# Patient Record
Sex: Male | Born: 1960 | Race: Black or African American | Hispanic: No | Marital: Married | Smoking: Never smoker
Health system: Southern US, Community
[De-identification: ages and names within clinical notes are randomized; demographics above are authoritative.]

## PROBLEM LIST (undated history)

## (undated) DIAGNOSIS — S0990XA Unspecified injury of head, initial encounter: Secondary | ICD-10-CM

## (undated) DIAGNOSIS — E349 Endocrine disorder, unspecified: Secondary | ICD-10-CM

## (undated) DIAGNOSIS — H269 Unspecified cataract: Secondary | ICD-10-CM

## (undated) DIAGNOSIS — N62 Hypertrophy of breast: Secondary | ICD-10-CM

## (undated) DIAGNOSIS — I493 Ventricular premature depolarization: Secondary | ICD-10-CM

## (undated) DIAGNOSIS — E785 Hyperlipidemia, unspecified: Secondary | ICD-10-CM

## (undated) DIAGNOSIS — H9209 Otalgia, unspecified ear: Secondary | ICD-10-CM

## (undated) DIAGNOSIS — R740 Nonspecific elevation of levels of transaminase and lactic acid dehydrogenase [LDH]: Secondary | ICD-10-CM

## (undated) DIAGNOSIS — M549 Dorsalgia, unspecified: Secondary | ICD-10-CM

## (undated) DIAGNOSIS — E291 Testicular hypofunction: Secondary | ICD-10-CM

## (undated) DIAGNOSIS — I10 Essential (primary) hypertension: Secondary | ICD-10-CM

## (undated) HISTORY — DX: Nonspecific elevation of levels of transaminase and lactic acid dehydrogenase (ldh): R74.0

## (undated) HISTORY — DX: Hypertrophy of breast: N62

## (undated) HISTORY — DX: Essential (primary) hypertension: I10

## (undated) HISTORY — DX: Hyperlipidemia, unspecified: E78.5

## (undated) HISTORY — DX: Otalgia, unspecified ear: H92.09

## (undated) HISTORY — DX: Unspecified cataract: H26.9

## (undated) HISTORY — DX: Unspecified injury of head, initial encounter: S09.90XA

## (undated) HISTORY — PX: NO PAST SURGERIES: SHX2092

## (undated) HISTORY — DX: Ventricular premature depolarization: I49.3

## (undated) HISTORY — DX: Testicular hypofunction: E29.1

## (undated) HISTORY — DX: Endocrine disorder, unspecified: E34.9

## (undated) HISTORY — DX: Dorsalgia, unspecified: M54.9

---

## 1998-03-29 ENCOUNTER — Emergency Department (HOSPITAL_COMMUNITY): Admission: EM | Admit: 1998-03-29 | Discharge: 1998-03-29 | Payer: Self-pay | Admitting: Emergency Medicine

## 1998-07-21 ENCOUNTER — Encounter: Admission: RE | Admit: 1998-07-21 | Discharge: 1998-07-21 | Payer: Self-pay | Admitting: *Deleted

## 2000-01-07 ENCOUNTER — Emergency Department (HOSPITAL_COMMUNITY): Admission: EM | Admit: 2000-01-07 | Discharge: 2000-01-07 | Payer: Self-pay | Admitting: Emergency Medicine

## 2005-01-29 ENCOUNTER — Ambulatory Visit: Payer: Self-pay | Admitting: Internal Medicine

## 2006-10-21 ENCOUNTER — Ambulatory Visit: Payer: Self-pay | Admitting: Internal Medicine

## 2006-12-30 ENCOUNTER — Ambulatory Visit: Payer: Self-pay | Admitting: Internal Medicine

## 2006-12-30 LAB — CONVERTED CEMR LAB
ALT: 12 units/L (ref 0–40)
AST: 17 units/L (ref 0–37)
Basophils Relative: 0.5 % (ref 0.0–1.0)
Bilirubin, Direct: 0.1 mg/dL (ref 0.0–0.3)
CO2: 29 meq/L (ref 19–32)
Calcium: 9.3 mg/dL (ref 8.4–10.5)
Chloride: 107 meq/L (ref 96–112)
Eosinophils Relative: 3 % (ref 0.0–5.0)
Glucose, Bld: 108 mg/dL — ABNORMAL HIGH (ref 70–99)
HDL: 42.6 mg/dL (ref >39.0)
Lymphocytes Relative: 44.8 % (ref 12.0–46.0)
Neutro Abs: 1.7 10*3/uL (ref 1.4–7.7)
Platelets: 207 10*3/uL (ref 150–400)
Total Protein: 7.2 g/dL (ref 6.0–8.3)
VLDL: 17 mg/dL (ref 0–40)
WBC: 3.9 10*3/uL — ABNORMAL LOW (ref 4.5–10.5)

## 2007-09-21 ENCOUNTER — Ambulatory Visit: Payer: Self-pay | Admitting: Internal Medicine

## 2007-09-21 DIAGNOSIS — H9209 Otalgia, unspecified ear: Secondary | ICD-10-CM

## 2007-09-21 HISTORY — DX: Otalgia, unspecified ear: H92.09

## 2007-10-23 ENCOUNTER — Ambulatory Visit: Payer: Self-pay | Admitting: Internal Medicine

## 2007-10-23 DIAGNOSIS — E785 Hyperlipidemia, unspecified: Secondary | ICD-10-CM

## 2007-10-23 HISTORY — DX: Hyperlipidemia, unspecified: E78.5

## 2008-04-25 ENCOUNTER — Telehealth: Payer: Self-pay | Admitting: Internal Medicine

## 2008-04-26 ENCOUNTER — Ambulatory Visit: Payer: Self-pay | Admitting: Internal Medicine

## 2008-07-07 ENCOUNTER — Ambulatory Visit: Payer: Self-pay | Admitting: Internal Medicine

## 2008-07-07 DIAGNOSIS — N62 Hypertrophy of breast: Secondary | ICD-10-CM

## 2008-07-07 HISTORY — DX: Hypertrophy of breast: N62

## 2008-07-07 LAB — CONVERTED CEMR LAB: hCG, Beta Chain, Quant, S: <2 milliintl units/mL

## 2008-07-08 LAB — CONVERTED CEMR LAB
Albumin: 4.1 g/dL (ref 3.5–5.2)
Alkaline Phosphatase: 52 units/L (ref 39–117)
BUN: 12 mg/dL (ref 6–23)
Bilirubin, Direct: 0.1 mg/dL (ref 0.0–0.3)
Eosinophils Absolute: 0.1 10*3/uL (ref 0.0–0.7)
GFR calc Af Amer: 92 mL/min
GFR calc non Af Amer: 76 mL/min
HCT: 40.6 % (ref 39.0–52.0)
MCV: 89.2 fL (ref 78.0–100.0)
Monocytes Absolute: 0.3 10*3/uL (ref 0.1–1.0)
Platelets: 154 10*3/uL (ref 150–400)
Potassium: 4.3 meq/L (ref 3.5–5.1)
RDW: 12.3 % (ref 11.5–14.6)
Sodium: 141 meq/L (ref 135–145)
Testosterone: 339.22 ng/dL — ABNORMAL LOW (ref 350.00–890)

## 2008-10-27 ENCOUNTER — Ambulatory Visit: Payer: Self-pay | Admitting: Internal Medicine

## 2008-10-27 DIAGNOSIS — M549 Dorsalgia, unspecified: Secondary | ICD-10-CM

## 2008-10-27 HISTORY — DX: Dorsalgia, unspecified: M54.9

## 2008-10-28 ENCOUNTER — Telehealth: Payer: Self-pay | Admitting: Internal Medicine

## 2008-11-18 ENCOUNTER — Ambulatory Visit: Payer: Self-pay | Admitting: Internal Medicine

## 2009-05-23 ENCOUNTER — Emergency Department (HOSPITAL_COMMUNITY): Admission: EM | Admit: 2009-05-23 | Discharge: 2009-05-23 | Payer: Self-pay | Admitting: Family Medicine

## 2009-05-24 ENCOUNTER — Ambulatory Visit: Payer: Self-pay | Admitting: Internal Medicine

## 2009-05-24 DIAGNOSIS — M25519 Pain in unspecified shoulder: Secondary | ICD-10-CM | POA: Insufficient documentation

## 2009-12-15 ENCOUNTER — Ambulatory Visit: Payer: Self-pay | Admitting: Internal Medicine

## 2009-12-15 LAB — CONVERTED CEMR LAB
AST: 19 units/L (ref 0–37)
Alkaline Phosphatase: 52 units/L (ref 39–117)
BUN: 11 mg/dL (ref 6–23)
Basophils Absolute: 0 10*3/uL (ref 0.0–0.1)
Bilirubin, Direct: 0 mg/dL (ref 0.0–0.3)
Calcium: 9.2 mg/dL (ref 8.4–10.5)
GFR calc non Af Amer: 82.24 mL/min (ref >60)
Glucose, Bld: 94 mg/dL (ref 70–99)
Glucose, Urine, Semiquant: NEGATIVE
HDL: 43.9 mg/dL (ref >39.00)
Ketones, urine, test strip: NEGATIVE
Lymphocytes Relative: 39.9 % (ref 12.0–46.0)
Lymphs Abs: 1.6 10*3/uL (ref 0.7–4.0)
Monocytes Relative: 6.6 % (ref 3.0–12.0)
Neutrophils Relative %: 47 % (ref 43.0–77.0)
Nitrite: NEGATIVE
Platelets: 172 10*3/uL (ref 150.0–400.0)
RDW: 13.7 % (ref 11.5–14.6)
Specific Gravity, Urine: 1.015
TSH: 1.64 microintl units/mL (ref 0.35–5.50)
Total Bilirubin: 0.8 mg/dL (ref 0.3–1.2)
Triglycerides: 78 mg/dL (ref 0.0–149.0)
VLDL: 15.6 mg/dL (ref 0.0–40.0)
pH: 8.5

## 2009-12-21 ENCOUNTER — Telehealth: Payer: Self-pay | Admitting: Internal Medicine

## 2010-01-04 ENCOUNTER — Ambulatory Visit: Payer: Self-pay | Admitting: Internal Medicine

## 2010-10-02 NOTE — Assessment & Plan Note (Signed)
Summary: CPX/CJR/RSC PT/CJR   Vital Signs:  Patient profile:   50 year old male Height:      66 inches Weight:      168 pounds BMI:     27.21 Temp:     97.7 degrees F oral BP sitting:   100 / 60  (right arm) Cuff size:   regular  Vitals Entered By: Duard Brady LPN (Jan 05, 6044 2:35 PM) CC: cpx - doing well    labs done Is Patient Diabetic? No   CC:  cpx - doing well    labs done.  History of Present Illness: 50 year old gentleman, African-American male, who is seen today for a health maintenance examination.  He immigrated from [place] about 20 years ago and does have a history of malaria and also a history of a positive PPD.  He is doing well today without concerns or complaints  Preventive Screening-Counseling & Management  Alcohol-Tobacco     Smoking Status: never  Allergies (verified): No Known Drug Allergies  Past History:  Past Medical History: Reviewed history from 11/18/2008 and no changes required. Hyperlipidemia remote history of malaria history of closed head injury in 1990 gynecomastia testosterone deficiency  Past Surgical History: Reviewed history from 10/23/2007 and no changes required. none  Family History: Reviewed history from 10/23/2007 and no changes required. details of his family history unclear, but felt as live in [place].  Father does have a history of diabetes and malaria and otherwise apparent enjoys good health.  Mother has a history of arthritis  4 brothers 3 sisters, health unknown  Social History: Reviewed history from 10/23/2007 and no changes required. 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 resident  in the states for approximately 17 yearsSmoking Status:  never  Review of Systems  The patient denies anorexia, fever, weight loss, weight gain, vision loss, decreased hearing, hoarseness, chest pain, syncope, dyspnea on exertion, peripheral edema, prolonged cough, headaches, hemoptysis, abdominal pain, melena, hematochezia,  severe indigestion/heartburn, hematuria, incontinence, genital sores, muscle weakness, suspicious skin lesions, transient blindness, difficulty walking, depression, unusual weight change, abnormal bleeding, enlarged lymph nodes, angioedema, breast masses, and testicular masses.    Physical Exam  General:  Well-developed,well-nourished,in no acute distress; alert,appropriate and cooperative throughout examination Head:  Normocephalic and atraumatic without obvious abnormalities. No apparent alopecia or balding. Eyes:  No corneal or conjunctival inflammation noted. EOMI. Perrla. Funduscopic exam benign, without hemorrhages, exudates or papilledema. Vision grossly normal. Ears:  External ear exam shows no significant lesions or deformities.  Otoscopic examination reveals clear canals, tympanic membranes are intact bilaterally without bulging, retraction, inflammation or discharge. Hearing is grossly normal bilaterally. Mouth:  Oral mucosa and oropharynx without lesions or exudates.  Teeth in good repair. Neck:  No deformities, masses, or tenderness noted. Chest Wall:  No deformities, masses, tenderness or gynecomastia noted. Breasts:  No masses or gynecomastia noted Lungs:  Normal respiratory effort, chest expands symmetrically. Lungs are clear to auscultation, no crackles or wheezes. Heart:  Normal rate and regular rhythm. S1 and S2 normal without gallop, murmur, click, rub or other extra sounds. Abdomen:  Bowel sounds positive,abdomen soft and non-tender without masses, organomegaly or hernias noted. Rectal:  No external abnormalities noted. Normal sphincter tone. No rectal masses or tenderness. Genitalia:  Testes bilaterally descended without nodularity, tenderness or masses. No scrotal masses or lesions. No penis lesions or urethral discharge. Prostate:  Prostate gland firm and smooth, no enlargement, nodularity, tenderness, mass, asymmetry or induration. Msk:  No deformity or scoliosis noted of  thoracic or lumbar spine.   Pulses:  R and L carotid,radial,femoral,dorsalis pedis and posterior tibial pulses are full and equal bilaterally Extremities:  No clubbing, cyanosis, edema, or deformity noted with normal full range of motion of all joints.   Neurologic:  No cranial nerve deficits noted. Station and gait are normal. Plantar reflexes are down-going bilaterally. DTRs are symmetrical throughout. Sensory, motor and coordinative functions appear intact. Skin:  Intact without suspicious lesions or rashes Cervical Nodes:  No lymphadenopathy noted Axillary Nodes:  No palpable lymphadenopathy Inguinal Nodes:  No significant adenopathy Psych:  Cognition and judgment appear intact. Alert and cooperative with normal attention span and concentration. No apparent delusions, illusions, hallucinations   Impression & Recommendations:  Problem # 1:  HEALTH MAINTENANCE EXAM (ICD-V70.0)  Complete Medication List: 1)  Tramadol Hcl 50 Mg Tabs (Tramadol hcl) .... One every 6 hours for pain prn 2)  Diclofenac Sodium 75 Mg Tbec (Diclofenac sodium) .... One twice daily prn 3)  Cyclobenzaprine Hcl 10 Mg Tabs (Cyclobenzaprine hcl) .... One tablet 3 times daily for acute pain prn  Patient Instructions: 1)  Please schedule a follow-up appointment in 1 year. 2)  It is important that you exercise regularly at least 20 minutes 5 times a week. If you develop chest pain, have severe difficulty breathing, or feel very tired , stop exercising immediately and seek medical attention. 3)  Schedule a colonoscopy/sigmoidoscopy to help detect colon cancer. Prescriptions: DICLOFENAC SODIUM 75 MG TBEC (DICLOFENAC SODIUM) one twice daily prn  #50 x 4   Entered and Authorized by:   Gordy Savers  MD   Signed by:   Gordy Savers  MD on 01/04/2010   Method used:   Print then Give to Patient   RxID:   [id number] TRAMADOL HCL 50 MG  TABS (TRAMADOL HCL) one every 6 hours for pain prn  #50 x 4   Entered and  Authorized by:   Gordy Savers  MD   Signed by:   Gordy Savers  MD on 01/04/2010   Method used:   Print then Give to Patient   RxID:   [id number]

## 2010-10-02 NOTE — Progress Notes (Signed)
Summary: No call No show   no call no show    attempt to call home # - ans mach- LMTCB to reschedule or explain NCNS.  Office will be closed tomorrow , Friday    KIK

## 2010-10-30 ENCOUNTER — Encounter: Payer: Self-pay | Admitting: Internal Medicine

## 2010-10-30 ENCOUNTER — Ambulatory Visit (INDEPENDENT_AMBULATORY_CARE_PROVIDER_SITE_OTHER): Payer: 59 | Admitting: Internal Medicine

## 2010-10-30 DIAGNOSIS — R12 Heartburn: Secondary | ICD-10-CM

## 2010-10-30 DIAGNOSIS — R079 Chest pain, unspecified: Secondary | ICD-10-CM

## 2010-10-30 NOTE — Progress Notes (Signed)
  Subjective:    Patient ID: Derek Wong, male    DOB: 06-13-1961, 50 y.o.   MRN: [id number]  HPI   50 year old patient who presents with a one-month history of intermittent chest pain. He describes both a typical midchest heartburn sensation that occurs primarily after meals. He also describes another vague pressure sensation in the left shoulder and arm area. Symptoms seem worse after meals. He denies any nocturnal symptoms. He denies any exertional chest pain. He does have a history of mild dyslipidemia. No other cardiovascular risk factors. EKG today is normal    Review of Systems  Constitutional: Negative for fever, chills, appetite change and fatigue.  HENT: Negative for hearing loss, ear pain, congestion, sore throat, trouble swallowing, neck stiffness, dental problem, voice change and tinnitus.   Eyes: Negative for pain, discharge and visual disturbance.  Respiratory: Negative for cough, chest tightness, wheezing and stridor.   Cardiovascular: Positive for chest pain. Negative for palpitations and leg swelling.  Gastrointestinal: Negative for nausea, vomiting, abdominal pain, diarrhea, constipation, blood in stool and abdominal distention.  Genitourinary: Negative for urgency, hematuria, flank pain, discharge, difficulty urinating and genital sores.  Musculoskeletal: Negative for myalgias, back pain, joint swelling, arthralgias and gait problem.  Skin: Negative for rash.  Neurological: Negative for dizziness, syncope, speech difficulty, weakness, numbness and headaches.  Hematological: Negative for adenopathy. Does not bruise/bleed easily.  Psychiatric/Behavioral: Negative for behavioral problems and dysphoric mood. The patient is not nervous/anxious.        Objective:   Physical Exam  Constitutional: He is oriented to person, place, and time. He appears well-developed.  HENT:  Head: Normocephalic.  Right Ear: External ear normal.  Left Ear: External ear normal.  Eyes:  Conjunctivae and EOM are normal.  Neck: Normal range of motion.  Cardiovascular: Normal rate, regular rhythm, normal heart sounds and intact distal pulses.   Pulmonary/Chest: Breath sounds normal.        Bilateral gynecomastia  Abdominal: Bowel sounds are normal. He exhibits no distension. There is no tenderness.  Musculoskeletal: Normal range of motion. He exhibits no edema and no tenderness.  Neurological: He is alert and oriented to person, place, and time.  Psychiatric: He has a normal mood and affect. His behavior is normal.          Assessment & Plan:   atypical chest pain most consistent with reflux.   Low risk of coronary artery disease and no exertional symptoms . We'll place on a antireflux regimen and a proton pump inhibitor and clinically observed. If symptoms do not pop to resolve we'll consider for more diagnostic testing

## 2010-10-30 NOTE — Patient Instructions (Signed)
Avoids foods high in acid such as tomatoes citrus juices, and spicy foods.  Avoid eating within two hours of lying down or before exercising.  Do not overheat.  Try smaller more frequent meals.  If symptoms persist, elevate the head of her bed 12 inches while sleeping.  Call or return to clinic prn if these symptoms worsen or fail to improve as anticipated. Acid Reflux (GERD)   Acid reflux is also known as Gastroesophageal Reflux Disease (GERD). Your stomach makes acid to help digest food. Acid reflux happens when acid from your stomach goes into the tube between your mouth and stomach (esophagus). Your stomach is protected from the acid, but your esophagus is not. When acid gets into the esophagus, it may cause a burning feeling in the chest (heartburn). Besides heartburn, other health problems can happen if the acid keeps going into the esophagus. Some causes of acid reflux include:  Being overweight.  Smoking.  Drinking alcohol.  Eating large meals.  Eating meals and then going to bed right away.  Eating certain foods.  Increased stomach acid production.   HOME CARE    Take all medicine as told by your doctor.  You may have to: l Lose weight. l Avoid alcohol. l Quit smoking.  Do not eat big meals. It is better to eat smaller meals throughout the day.  Do not eat a meal and then nap or go to bed.    Sleep with your head higher than your stomach.  Avoid foods that bother you.  You may need more tests, or you may have to see a special doctor.   GET HELP RIGHT AWAY IF:    You have chest pain that is different than before.    You have pain that goes to your arms, jaw or between your shoulder blades.    You throw up (vomit) blood, dark brown liquid, or your throw up looks like coffee grounds.  You have trouble swallowing.  You have trouble breathing or cannot stop coughing.  You feel dizzy or pass out.  Your skin is cool, wet and pale.    Your medicine is not  helping.   MAKE SURE YOU:      Understand these instructions.    Will watch your condition.  Will get help right away if you are not doing well or get worse.   Document Released: 02/05/2008  Document Re-Released: 09/10/2009 [facility] Patient Information 2011 [place], [place].

## 2011-01-18 NOTE — Assessment & Plan Note (Signed)
[facility] OFFICE NOTE   Derek Wong, Derek Wong                   MRN:          [id number]  DATE:10/21/2006                            DOB:          02/18/1961    A 50 year old [place] American male seen today for a wellness  examination. He has enjoyed excellent health. He has a remote history of  malaria. He was also hospitalized in 1990 in [place] for a closed head  injury. He was last seen a couple of years ago and laboratory screen was  unremarkable.   REVIEW OF SYSTEMS:  Is negative. Only complaint is some mild occasional  fatigue.   MEDICATIONS:  He takes no medications.   FAMILY HISTORY:  Both parents are still living, although there is little  contact. Father with a history of diabetes and mother with a history of  arthritis. He has a number of brothers and sisters, but no details of  their medical health are known.   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 He  has been in the country for approximately [place] years.   PHYSICAL EXAMINATION:  Revealed a healthy-appearing, fit male in no  acute distress. Blood pressure 130/80.  SKIN: Negative.  FUNDI, EARS, NOSE AND THROAT: Clear.  NECK: No adenopathy. No bruits.  CHEST: Clear.  CARDIOVASCULAR: Normal heart sounds. No murmurs.  ABDOMEN: Benign. No organomegaly.  EXTERNAL GENITALIA: Normal.  RECTAL: Prostate small and benign. Stool heme negative.  EXTREMITIES: Negative will full peripheral pulses.   IMPRESSION:  Unremarkable clinical examination.   DISPOSITION:  The patient will return at his convenience for laboratory  panel. More regular exercise regimen encouraged. Return in 2 to 3 years  for followup.     Gordy Savers, MD  Electronically Signed    PFK/MedQ  DD: 10/21/2006  DT: 10/21/2006  Job #: [id number]

## 2011-02-01 ENCOUNTER — Other Ambulatory Visit (INDEPENDENT_AMBULATORY_CARE_PROVIDER_SITE_OTHER): Payer: 59

## 2011-02-01 DIAGNOSIS — Z0000 Encounter for general adult medical examination without abnormal findings: Secondary | ICD-10-CM

## 2011-02-01 LAB — CBC WITH DIFFERENTIAL/PLATELET
Basophils Absolute: 0 10*3/uL (ref 0.0–0.1)
Eosinophils Absolute: 0.1 10*3/uL (ref 0.0–0.7)
HCT: 39.1 % (ref 39.0–52.0)
Lymphs Abs: 1.3 10*3/uL (ref 0.7–4.0)
MCHC: 34.5 g/dL (ref 30.0–36.0)
Monocytes Absolute: 0.2 10*3/uL (ref 0.1–1.0)
Monocytes Relative: 5 % (ref 3.0–12.0)
Neutro Abs: 3 10*3/uL (ref 1.4–7.7)
Platelets: 175 10*3/uL (ref 150.0–400.0)
RDW: 13.1 % (ref 11.5–14.6)

## 2011-02-01 LAB — POCT URINALYSIS DIPSTICK
Bilirubin, UA: NEGATIVE
Glucose, UA: NEGATIVE
Ketones, UA: NEGATIVE
Nitrite, UA: NEGATIVE
pH, UA: 8.5

## 2011-02-01 LAB — LIPID PANEL
Cholesterol: 178 mg/dL (ref 0–200)
HDL: 46.9 mg/dL (ref >39.00)
LDL Cholesterol: 120 mg/dL — ABNORMAL HIGH (ref 0–99)
Triglycerides: 58 mg/dL (ref 0.0–149.0)
VLDL: 11.6 mg/dL (ref 0.0–40.0)

## 2011-02-01 LAB — BASIC METABOLIC PANEL
BUN: 18 mg/dL (ref 6–23)
CO2: 27 mEq/L (ref 19–32)
GFR: 71.47 mL/min (ref >60.00)
Glucose, Bld: 102 mg/dL — ABNORMAL HIGH (ref 70–99)
Potassium: 4.4 mEq/L (ref 3.5–5.1)

## 2011-02-01 LAB — HEPATIC FUNCTION PANEL
Albumin: 3.9 g/dL (ref 3.5–5.2)
Total Bilirubin: 0.9 mg/dL (ref 0.3–1.2)

## 2011-02-08 ENCOUNTER — Encounter: Payer: Self-pay | Admitting: Internal Medicine

## 2011-02-08 ENCOUNTER — Ambulatory Visit (INDEPENDENT_AMBULATORY_CARE_PROVIDER_SITE_OTHER): Payer: 59 | Admitting: Internal Medicine

## 2011-02-08 DIAGNOSIS — Z0000 Encounter for general adult medical examination without abnormal findings: Secondary | ICD-10-CM

## 2011-02-08 DIAGNOSIS — R7401 Elevation of levels of liver transaminase levels: Secondary | ICD-10-CM

## 2011-02-08 HISTORY — DX: Elevation of levels of liver transaminase levels: R74.01

## 2011-02-08 NOTE — Patient Instructions (Signed)
It is important that you exercise regularly, at least 20 minutes 3 to 4 times per week.  If you develop chest pain or shortness of breath seek  medical attention.  Return in October for a flu vaccine and also repeat liver function testing  Schedule your colonoscopy to help detect colon cancer.

## 2011-02-08 NOTE — Progress Notes (Signed)
Subjective:    Patient ID: Derek Wong, male    DOB: 1961-03-27, 50 y.o.   MRN: [id number]  HPI  50 year old patient who is seen today for an annual health examination. He is still well and is asymptomatic past medical history is pertinent for a heart history malaria as well as a history of a positive PPD. He will be traveling to [place] this summer;  he will check with the health department as far as malaria prophylaxis and other immunizations.  Laboratory studies were reviewed and revealed mild elevated transaminases. No history of ethanol use drug therapy hepatitis or blood transfusions. No family history of liver disease  CC: cpx - doing well labs done.  History of Present Illness:  50 year old gentleman, African-American male, who is seen today for a health maintenance examination. He immigrated from [place] about 20 years ago and does have a history of malaria and also a history of a positive PPD. He is doing well today without concerns or complaints  Preventive Screening-Counseling & Management  Alcohol-Tobacco  Smoking Status: never  Allergies (verified):  No Known Drug Allergies  Past History:  Past Medical History:  Reviewed history from 11/18/2008 and no changes required.  Hyperlipidemia  remote history of malaria  history of closed head injury in 1990  gynecomastia  testosterone deficiency  Past Surgical History:  Reviewed history from 10/23/2007 and no changes required.  none  Family History:  Reviewed history from 10/23/2007 and no changes required.  details of his family history unclear, but felt as live in [place]. Father does have a history of diabetes and malaria and otherwise apparent enjoys good health. Mother has a history of arthritis  4 brothers 3 sisters, health unknown  Social History:  Reviewed history from 10/23/2007 and no changes required.  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  resident in the states for approximately 17 yearsSmoking Status: never      Review of Systems  Constitutional: Negative for fever, chills, activity change, appetite change and fatigue.  HENT: Negative for hearing loss, ear pain, congestion, rhinorrhea, sneezing, mouth sores, trouble swallowing, neck pain, neck stiffness, dental problem, voice change, sinus pressure and tinnitus.   Eyes: Negative for photophobia, pain, redness and visual disturbance.  Respiratory: Negative for apnea, cough, choking, chest tightness, shortness of breath and wheezing.   Cardiovascular: Negative for chest pain, palpitations and leg swelling.  Gastrointestinal: Negative for nausea, vomiting, abdominal pain, diarrhea, constipation, blood in stool, abdominal distention, anal bleeding and rectal pain.  Genitourinary: Negative for dysuria, urgency, frequency, hematuria, flank pain, decreased urine volume, discharge, penile swelling, scrotal swelling, difficulty urinating, genital sores and testicular pain.  Musculoskeletal: Negative for myalgias, back pain, joint swelling, arthralgias and gait problem.  Skin: Negative for color change, rash and wound.  Neurological: Negative for dizziness, tremors, seizures, syncope, facial asymmetry, speech difficulty, weakness, light-headedness, numbness and headaches.  Hematological: Negative for adenopathy. Does not bruise/bleed easily.  Psychiatric/Behavioral: Negative for suicidal ideas, hallucinations, behavioral problems, confusion, sleep disturbance, self-injury, dysphoric mood, decreased concentration and agitation. The patient is not nervous/anxious.        Objective:   Physical Exam  Constitutional: He appears well-developed and well-nourished.  HENT:  Head: Normocephalic and atraumatic.  Right Ear: External ear normal.  Left Ear: External ear normal.  Nose: Nose normal.  Mouth/Throat: Oropharynx is clear and moist.  Eyes: Conjunctivae and EOM are normal. Pupils are equal, round, and reactive to light. No scleral icterus.  Neck: Normal  range of motion. Neck  supple. No JVD present. No thyromegaly present.  Cardiovascular: Regular rhythm, normal heart sounds and intact distal pulses.  Exam reveals no gallop and no friction rub.   No murmur heard. Pulmonary/Chest: Effort normal and breath sounds normal. He exhibits no tenderness.       Mild bilateral gynecomastia  Abdominal: Soft. Bowel sounds are normal. He exhibits no distension and no mass. There is no tenderness.  Genitourinary: Prostate normal and penis normal. Guaiac negative stool. No penile tenderness.  Musculoskeletal: Normal range of motion. He exhibits no edema and no tenderness.  Lymphadenopathy:    He has no cervical adenopathy.  Neurological: He is alert. He has normal reflexes. No cranial nerve deficit. Coordination normal.  Skin: Skin is warm and dry. No rash noted.  Psychiatric: He has a normal mood and affect. His behavior is normal.          Assessment & Plan:   Annual health assessment. He will check with health Department concerning antimalarial prophylaxis. Mildly elevated LFTs. Suggested when the patient returns in the fall for a flu vaccine to have LFTs repeated.

## 2011-03-03 ENCOUNTER — Emergency Department (HOSPITAL_COMMUNITY): Payer: 59

## 2011-03-03 ENCOUNTER — Emergency Department (HOSPITAL_COMMUNITY)
Admission: EM | Admit: 2011-03-03 | Discharge: 2011-03-03 | Disposition: A | Discharge status: Home / Self Care | Payer: 59 | Attending: Emergency Medicine | Admitting: Emergency Medicine

## 2011-03-03 DIAGNOSIS — M25579 Pain in unspecified ankle and joints of unspecified foot: Secondary | ICD-10-CM | POA: Insufficient documentation

## 2011-03-03 DIAGNOSIS — M25476 Effusion, unspecified foot: Secondary | ICD-10-CM | POA: Insufficient documentation

## 2011-03-03 DIAGNOSIS — M25473 Effusion, unspecified ankle: Secondary | ICD-10-CM | POA: Insufficient documentation

## 2011-03-03 DIAGNOSIS — X58XXXA Exposure to other specified factors, initial encounter: Secondary | ICD-10-CM | POA: Insufficient documentation

## 2011-03-03 DIAGNOSIS — S93409A Sprain of unspecified ligament of unspecified ankle, initial encounter: Secondary | ICD-10-CM | POA: Insufficient documentation

## 2011-12-27 ENCOUNTER — Other Ambulatory Visit (INDEPENDENT_AMBULATORY_CARE_PROVIDER_SITE_OTHER): Payer: 59

## 2011-12-27 DIAGNOSIS — Z0000 Encounter for general adult medical examination without abnormal findings: Secondary | ICD-10-CM

## 2011-12-27 LAB — CBC WITH DIFFERENTIAL/PLATELET
Basophils Absolute: 0 10*3/uL (ref 0.0–0.1)
Basophils Relative: 0.6 % (ref 0.0–3.0)
Eosinophils Absolute: 0.2 10*3/uL (ref 0.0–0.7)
Eosinophils Relative: 3.8 % (ref 0.0–5.0)
HCT: 40.8 % (ref 39.0–52.0)
Hemoglobin: 13.9 g/dL (ref 13.0–17.0)
Lymphocytes Relative: 43 % (ref 12.0–46.0)
Lymphs Abs: 1.9 10*3/uL (ref 0.7–4.0)
MCHC: 34.1 g/dL (ref 30.0–36.0)
MCV: 88 fl (ref 78.0–100.0)
Monocytes Absolute: 0.3 10*3/uL (ref 0.1–1.0)
Monocytes Relative: 5.9 % (ref 3.0–12.0)
Neutro Abs: 2.1 10*3/uL (ref 1.4–7.7)
Neutrophils Relative %: 46.7 % (ref 43.0–77.0)
Platelets: 172 10*3/uL (ref 150.0–400.0)
RBC: 4.64 Mil/uL (ref 4.22–5.81)
RDW: 13.6 % (ref 11.5–14.6)
WBC: 4.5 10*3/uL (ref 4.5–10.5)

## 2011-12-27 LAB — LIPID PANEL
HDL: 45.5 mg/dL (ref >39.00)
Triglycerides: 78 mg/dL (ref 0.0–149.0)
VLDL: 15.6 mg/dL (ref 0.0–40.0)

## 2011-12-27 LAB — BASIC METABOLIC PANEL
CO2: 28 mEq/L (ref 19–32)
Calcium: 9.8 mg/dL (ref 8.4–10.5)
Chloride: 104 mEq/L (ref 96–112)
Creatinine, Ser: 1.4 mg/dL (ref 0.4–1.5)
Glucose, Bld: 92 mg/dL (ref 70–99)
Sodium: 140 mEq/L (ref 135–145)

## 2011-12-27 LAB — POCT URINALYSIS DIPSTICK
Bilirubin, UA: NEGATIVE
Glucose, UA: NEGATIVE
Ketones, UA: NEGATIVE
Leukocytes, UA: NEGATIVE
Nitrite, UA: NEGATIVE
pH, UA: 7

## 2011-12-27 LAB — HEPATIC FUNCTION PANEL
AST: 15 U/L (ref 0–37)
Alkaline Phosphatase: 58 U/L (ref 39–117)
Total Bilirubin: 0.8 mg/dL (ref 0.3–1.2)

## 2012-01-03 ENCOUNTER — Ambulatory Visit (INDEPENDENT_AMBULATORY_CARE_PROVIDER_SITE_OTHER): Payer: 59 | Admitting: Internal Medicine

## 2012-01-03 ENCOUNTER — Encounter: Payer: Self-pay | Admitting: Internal Medicine

## 2012-01-03 VITALS — BP 120/80

## 2012-01-03 DIAGNOSIS — Z0000 Encounter for general adult medical examination without abnormal findings: Secondary | ICD-10-CM

## 2012-01-03 DIAGNOSIS — E785 Hyperlipidemia, unspecified: Secondary | ICD-10-CM

## 2012-01-03 NOTE — Patient Instructions (Signed)
It is important that you exercise regularly, at least 20 minutes 3 to 4 times per week.  If you develop chest pain or shortness of breath seek  medical attention.  Cholesterol Cholesterol is a white, waxy, fat-like protein needed by your body in small amounts. The liver makes all the cholesterol you need. It is carried from the liver by the blood through the blood vessels. Deposits (plaque) may build up on blood vessel walls. This makes the arteries narrower and stiffer. Plaque increases the risk for heart attack and stroke. You cannot feel your cholesterol level even if it is very high. The only way to know is by a blood test to check your lipid (fats) levels. Once you know your cholesterol levels, you should keep a record of the test results. Work with your caregiver to to keep your levels in the desired range. WHAT THE RESULTS MEAN:  Total cholesterol is a rough measure of all the cholesterol in your blood.   LDL is the so-called bad cholesterol. This is the type that deposits cholesterol in the walls of the arteries. You want this level to be low.   HDL is the good cholesterol because it cleans the arteries and carries the LDL away. You want this level to be high.   Triglycerides are fat that the body can either burn for energy or store. High levels are closely linked to heart disease.  DESIRED LEVELS:  Total cholesterol below 200.   LDL below 100 for people at risk, below 70 for very high risk.   HDL above 50 is good, above 60 is best.   Triglycerides below 150.  HOW TO LOWER YOUR CHOLESTEROL:  Diet.   Choose fish or white meat chicken and [place], roasted or baked. Limit fatty cuts of red meat, fried foods, and processed meats, such as sausage and lunch meat.   Eat lots of fresh fruits and vegetables. Choose whole grains, beans, pasta, potatoes and cereals.   Use only small amounts of olive, corn or canola oils. Avoid butter, mayonnaise, shortening or palm kernel oils. Avoid  foods with trans-fats.   Use skim/nonfat milk and low-fat/nonfat yogurt and cheeses. Avoid whole milk, cream, ice cream, egg yolks and cheeses. Healthy desserts include angel food cake, ginger snaps, animal crackers, hard candy, popsicles, and low-fat/nonfat frozen yogurt. Avoid pastries, cakes, pies and cookies.   Exercise.   A regular program helps decrease LDL and raises HDL.   Helps with weight control.   Do things that increase your activity level like gardening, walking, or taking the stairs.   Medication.   May be prescribed by your caregiver to help lowering cholesterol and the risk for heart disease.   You may need medicine even if your levels are normal if you have several risk factors.  HOME CARE INSTRUCTIONS    Follow your diet and exercise programs as suggested by your caregiver.   Take medications as directed.   Have blood work done when your caregiver feels it is necessary.  MAKE SURE YOU:    Understand these instructions.   Will watch your condition.   Will get help right away if you are not doing well or get worse.  Document Released: 05/14/2001 Document Revised: 08/08/2011 Document Reviewed: 11/04/2007 [facility] Patient Information 2012 [place], [place].Cholesterol Control Diet Cholesterol levels in your body are determined significantly by your diet. Cholesterol levels may also be related to heart disease. The following material helps to explain this relationship and discusses what you can do  to help keep your heart healthy. Not all cholesterol is bad. Low-density lipoprotein (LDL) cholesterol is the "bad" cholesterol. It may cause fatty deposits to build up inside your arteries. High-density lipoprotein (HDL) cholesterol is "good." It helps to remove the "bad" LDL cholesterol from your blood. Cholesterol is a very important risk factor for heart disease. Other risk factors are high blood pressure, smoking, stress, heredity, and weight. The heart muscle gets its  supply of blood through the coronary arteries. If your LDL cholesterol is high and your HDL cholesterol is low, you are at risk for having fatty deposits build up in your coronary arteries. This leaves less room through which blood can flow. Without sufficient blood and oxygen, the heart muscle cannot function properly and you may feel chest pains (angina pectoris). When a coronary artery closes up entirely, a part of the heart muscle may die, causing a heart attack (myocardial infarction). CHECKING CHOLESTEROL When your caregiver sends your blood to a lab to be analyzed for cholesterol, a complete lipid (fat) profile may be done. With this test, the total amount of cholesterol and levels of LDL and HDL are determined. Triglycerides are a type of fat that circulates in the blood and can also be used to determine heart disease risk. The list below describes what the numbers should be: Test: Total Cholesterol.  Less than 200 mg/dl.  Test: LDL "bad cholesterol."  Less than 100 mg/dl.   Less than 70 mg/dl if you are at very high risk of a heart attack or sudden cardiac death.  Test: HDL "good cholesterol."  Greater than 50 mg/dl for women.   Greater than 40 mg/dl for men.  Test: Triglycerides.  Less than 150 mg/dl.  CONTROLLING CHOLESTEROL WITH DIET Although exercise and lifestyle factors are important, your diet is key. That is because certain foods are known to raise cholesterol and others to lower it. The goal is to balance foods for their effect on cholesterol and more importantly, to replace saturated and trans fat with other types of fat, such as monounsaturated fat, polyunsaturated fat, and omega-3 fatty acids. On average, a person should consume no more than 15 to 17 g of saturated fat daily. Saturated and trans fats are considered "bad" fats, and they will raise LDL cholesterol. Saturated fats are primarily found in animal products such as meats, butter, and cream. However, that does not  mean you need to sacrifice all your favorite foods. Today, there are good tasting, low-fat, low-cholesterol substitutes for most of the things you like to eat. Choose low-fat or nonfat alternatives. Choose round or loin cuts of red meat, since these types of cuts are lowest in fat and cholesterol. Chicken (without the skin), fish, veal, and ground [place] breast are excellent choices. Eliminate fatty meats, such as hot dogs and salami. Even shellfish have little or no saturated fat. Have a 3 oz (85 g) portion when you eat lean meat, poultry, or fish. Trans fats are also called "partially hydrogenated oils." They are oils that have been scientifically manipulated so that they are solid at room temperature resulting in a longer shelf life and improved taste and texture of foods in which they are added. Trans fats are found in stick margarine, some tub margarines, cookies, crackers, and baked goods.   When baking and cooking, oils are an excellent substitute for butter. The monounsaturated oils are especially beneficial since it is believed they lower LDL and raise HDL. The oils you should avoid entirely are saturated tropical  oils, such as coconut and palm.   Remember to eat liberally from food groups that are naturally free of saturated and trans fat, including fish, fruit, vegetables, beans, grains (barley, rice, couscous, bulgur wheat), and pasta (without cream sauces).   IDENTIFYING FOODS THAT LOWER CHOLESTEROL   Soluble fiber may lower your cholesterol. This type of fiber is found in fruits such as apples, vegetables such as broccoli, potatoes, and carrots, legumes such as beans, peas, and lentils, and grains such as barley. Foods fortified with plant sterols (phytosterol) may also lower cholesterol. You should eat at least 2 g per day of these foods for a cholesterol lowering effect.   Read package labels to identify low-saturated fats, trans fats free, and low-fat foods at the supermarket. Select cheeses  that have only 2 to 3 g saturated fat per ounce. Use a heart-healthy tub margarine that is free of trans fats or partially hydrogenated oil. When buying baked goods (cookies, crackers), avoid partially hydrogenated oils. Breads and muffins should be made from whole grains (whole-wheat or whole oat flour, instead of "flour" or "enriched flour"). Buy non-creamy canned soups with reduced salt and no added fats.   FOOD PREPARATION TECHNIQUES   Never deep-fry. If you must fry, either stir-fry, which uses very little fat, or use non-stick cooking sprays. When possible, broil, bake, or roast meats, and steam vegetables. Instead of dressing vegetables with butter or margarine, use lemon and herbs, applesauce and cinnamon (for squash and sweet potatoes), nonfat yogurt, salsa, and low-fat dressings for salads.   LOW-SATURATED FAT / LOW-FAT FOOD SUBSTITUTES Meats / Saturated Fat (g)  Avoid: Steak, marbled (3 oz/85 g) / 11 g   Choose: Steak, lean (3 oz/85 g) / 4 g   Avoid: Hamburger (3 oz/85 g) / 7 g   Choose: Hamburger, lean (3 oz/85 g) / 5 g   Avoid: Ham (3 oz/85 g) / 6 g   Choose: Ham, lean cut (3 oz/85 g) / 2.4 g   Avoid: Chicken, with skin, dark meat (3 oz/85 g) / 4 g   Choose: Chicken, skin removed, dark meat (3 oz/85 g) / 2 g   Avoid: Chicken, with skin, light meat (3 oz/85 g) / 2.5 g   Choose: Chicken, skin removed, light meat (3 oz/85 g) / 1 g  Dairy / Saturated Fat (g)  Avoid: Whole milk (1 cup) / 5 g   Choose: Low-fat milk, 2% (1 cup) / 3 g   Choose: Low-fat milk, 1% (1 cup) / 1.5 g   Choose: Skim milk (1 cup) / 0.3 g   Avoid: Hard cheese (1 oz/28 g) / 6 g   Choose: Skim milk cheese (1 oz/28 g) / 2 to 3 g   Avoid: Cottage cheese, 4% fat (1 cup) / 6.5 g   Choose: Low-fat cottage cheese, 1% fat (1 cup) / 1.5 g   Avoid: Ice cream (1 cup) / 9 g   Choose: Sherbet (1 cup) / 2.5 g   Choose: Nonfat frozen yogurt (1 cup) / 0.3 g   Choose: Frozen fruit bar / trace   Avoid:  Whipped cream (1 tbs) / 3.5 g   Choose: Nondairy whipped topping (1 tbs) / 1 g  Condiments / Saturated Fat (g)  Avoid: Mayonnaise (1 tbs) / 2 g   Choose: Low-fat mayonnaise (1 tbs) / 1 g   Avoid: Butter (1 tbs) / 7 g   Choose: Extra light margarine (1 tbs) / 1 g   Avoid:  Coconut oil (1 tbs) / 11.8 g   Choose: Olive oil (1 tbs) / 1.8 g   Choose: Corn oil (1 tbs) / 1.7 g   Choose: Safflower oil (1 tbs) / 1.2 g   Choose: Sunflower oil (1 tbs) / 1.4 g   Choose: Soybean oil (1 tbs) / 2.4 g   Choose: Canola oil (1 tbs) / 1 g  Document Released: 08/19/2005 Document Revised: 08/08/2011 Document Reviewed: 02/07/2011 [facility] Patient Information 2012 [place], [place].

## 2012-01-03 NOTE — Progress Notes (Signed)
Subjective:    Patient ID: Derek Wong, male    DOB: 03-11-1961, 51 y.o.   MRN: [id number]  HPI  51 year old patient who is in today for a wellness exam. He takes no chronic medications. Laboratory studies were reviewed. His only complaint is some occasional nonexertional sharp chest pain this is often present in the left mid back in the interscapular area as well. No exertional chest pain  Past Medical History  Diagnosis Date  . BACK PAIN 10/27/2008  . GYNECOMASTIA, UNILATERAL 07/07/2008  . HYPERLIPIDEMIA 10/23/2007  . OTALGIA 09/21/2007  . 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 Testosterone deficiency   . Head injury, closed     History   Social History  . Marital Status: Married    Spouse Name: N/A    Number of Children: N/A  . Years of Education: N/A   Occupational History  . Not on file.   Social History Main Topics  . Smoking status: Never Smoker   . Smokeless tobacco: Never Used  . Alcohol Use: No  . Drug Use: No  . Sexually Active: Not on file   Other Topics Concern  . Not on file   Social History Narrative  . No narrative on file    No past surgical history on file.  No family history on file.  No Known Allergies  Current Outpatient Prescriptions on File Prior to Visit  Medication Sig Dispense Refill  . cyclobenzaprine (FLEXERIL) 10 MG tablet Take 10 mg by mouth 3 (three) times daily as needed.        . diclofenac (VOLTAREN) 75 MG EC tablet Take 75 mg by mouth 2 (two) times daily.        . traMADol (ULTRAM) 50 MG tablet Take 50 mg by mouth every 6 (six) hours as needed.          BP 120/80  Alcohol-Tobacco  Smoking Status: never  Allergies (verified):  No Known Drug Allergies   Past History:  Past Medical History:  Reviewed history from 11/18/2008 and no changes required.  Hyperlipidemia  remote history of malaria  history of closed head injury in 1990  gynecomastia  testosterone deficiency   Past Surgical History:    Reviewed history from 10/23/2007 and no changes required.  none   Family History:  Reviewed history from 10/23/2007 and no changes required.  details of his family history unclear, but felt as live in [place]. Father does have a history of diabetes and malaria and otherwise apparent enjoys good health. Mother has a history of arthritis  4 brothers 3 sisters, health unknown   Social History:  Reviewed history from 10/23/2007 and no changes required.  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  resident [place] states for approximately 17 yearsSmoking Status: never        Review of Systems  Constitutional: Negative for fever, chills, activity change, appetite change and fatigue.  HENT: Negative for hearing loss, ear pain, congestion, rhinorrhea, sneezing, mouth sores, trouble swallowing, neck pain, neck stiffness, dental problem, voice change, sinus pressure and tinnitus.   Eyes: Negative for photophobia, pain, redness and visual disturbance.  Respiratory: Positive for shortness of breath. Negative for apnea, cough, choking, chest tightness and wheezing.   Cardiovascular: Positive for chest pain. Negative for palpitations and leg swelling.  Gastrointestinal: Negative for nausea, vomiting, abdominal pain, diarrhea, constipation, blood in stool, abdominal distention, anal bleeding and rectal pain.  Genitourinary: Negative for dysuria, urgency, frequency, hematuria, flank pain, decreased urine  volume, discharge, penile swelling, scrotal swelling, difficulty urinating, genital sores and testicular pain.  Musculoskeletal: Negative for myalgias, back pain, joint swelling, arthralgias and gait problem.  Skin: Negative for color change, rash and wound.  Neurological: Negative for dizziness, tremors, seizures, syncope, facial asymmetry, speech difficulty, weakness, light-headedness, numbness and headaches.  Hematological: Negative for adenopathy. Does not bruise/bleed easily.  Psychiatric/Behavioral:  Negative for suicidal ideas, hallucinations, behavioral problems, confusion, sleep disturbance, self-injury, dysphoric mood, decreased concentration and agitation. The patient is not nervous/anxious.        Objective:   Physical Exam  Constitutional: He appears well-developed and well-nourished.  HENT:  Head: Normocephalic and atraumatic.  Right Ear: External ear normal.  Left Ear: External ear normal.  Nose: Nose normal.  Mouth/Throat: Oropharynx is clear and moist.  Eyes: Conjunctivae and EOM are normal. Pupils are equal, round, and reactive to light. No scleral icterus.  Neck: Normal range of motion. Neck supple. No JVD present. No thyromegaly present.  Cardiovascular: Regular rhythm, normal heart sounds and intact distal pulses.  Exam reveals no gallop and no friction rub.   No murmur heard. Pulmonary/Chest: Effort normal and breath sounds normal. He exhibits no tenderness.  Abdominal: Soft. Bowel sounds are normal. He exhibits no distension and no mass. There is no tenderness.  Genitourinary: Prostate normal and penis normal.  Musculoskeletal: Normal range of motion. He exhibits no edema and no tenderness.  Lymphadenopathy:    He has no cervical adenopathy.  Neurological: He is alert. He has normal reflexes. No cranial nerve deficit. Coordination normal.  Skin: Skin is warm and dry. No rash noted.  Psychiatric: He has a normal mood and affect. His behavior is normal.          Assessment & Plan:    Preventive health examination. We'll set up screening colonoscopy Richard studies reviewed. With that exercise more and observea heart healthy diet

## 2012-09-01 ENCOUNTER — Encounter: Payer: Self-pay | Admitting: Internal Medicine

## 2012-09-01 ENCOUNTER — Ambulatory Visit (INDEPENDENT_AMBULATORY_CARE_PROVIDER_SITE_OTHER): Payer: 59 | Admitting: Internal Medicine

## 2012-09-01 VITALS — BP 140/90 | HR 75 | Temp 98.2°F | Resp 18 | Wt 172.0 lb

## 2012-09-01 DIAGNOSIS — R300 Dysuria: Secondary | ICD-10-CM

## 2012-09-01 DIAGNOSIS — R319 Hematuria, unspecified: Secondary | ICD-10-CM

## 2012-09-01 LAB — POCT URINALYSIS DIPSTICK
Bilirubin, UA: NEGATIVE
Ketones, UA: NEGATIVE
Spec Grav, UA: <=1.005
pH, UA: 8

## 2012-09-01 NOTE — Progress Notes (Signed)
  Subjective:    Patient ID: Derek Wong, male    DOB: 1961/05/17, 51 y.o.   MRN: [id number]  HPI  51 year old patient who is seen today with a chief complaint of an episode of gross hematuria. 2 days ago he had the urge to urinate but do to lack of facilities he was forced to postpone micturation. When he finally did void he experienced some urgency and noted a small amount of gross hematuria at the onset of urination that cleared quickly. There may have been some mild dysuria at this time. Subsequently he has had no further urgency dysuria or hematuria. Urinalysis was reviewed and the spraying and was normal UA today was essentially normal with possibly a trace occult blood.  Past Medical History  Diagnosis Date  . BACK PAIN 10/27/2008  . GYNECOMASTIA, UNILATERAL 07/07/2008  . HYPERLIPIDEMIA 10/23/2007  . OTALGIA 09/21/2007  . 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 Testosterone deficiency   . Head injury, closed     History   Social History  . Marital Status: Married    Spouse Name: N/A    Number of Children: N/A  . Years of Education: N/A   Occupational History  . Not on file.   Social History Main Topics  . Smoking status: Never Smoker   . Smokeless tobacco: Never Used  . Alcohol Use: No  . Drug Use: No  . Sexually Active: Not on file   Other Topics Concern  . Not on file   Social History Narrative  . No narrative on file    No past surgical history on file.  No family history on file.  No Known Allergies  Current Outpatient Prescriptions on File Prior to Visit  Medication Sig Dispense Refill  . cyclobenzaprine (FLEXERIL) 10 MG tablet Take 10 mg by mouth 3 (three) times daily as needed.        . diclofenac (VOLTAREN) 75 MG EC tablet Take 75 mg by mouth 2 (two) times daily.        . traMADol (ULTRAM) 50 MG tablet Take 50 mg by mouth every 6 (six) hours as needed.          BP 140/90  Pulse 75  Temp 98.2 F (36.8 C) (Oral)  Resp 18  Wt  172 lb (78.019 kg)  SpO2 98%       Review of Systems  Genitourinary: Positive for dysuria, urgency and hematuria. Negative for frequency, flank pain, discharge, genital sores and penile pain.       Objective:   Physical Exam  Constitutional: He is oriented to person, place, and time. He appears well-developed.  HENT:  Head: Normocephalic.  Right Ear: External ear normal.  Left Ear: External ear normal.  Eyes: Conjunctivae normal and EOM are normal.  Neck: Normal range of motion.  Cardiovascular: Normal rate and normal heart sounds.   Pulmonary/Chest: Breath sounds normal.  Abdominal: Bowel sounds are normal.  Musculoskeletal: Normal range of motion. He exhibits no edema and no tenderness.  Neurological: He is alert and oriented to person, place, and time.  Psychiatric: He has a normal mood and affect. His behavior is normal.          Assessment & Plan:   Episode of gross hematuria. Symptoms have not reoccurred. Possibly passed a small stone. We'll simply observe at this time we'll see for his annual physical in 4 months we'll recheck her UA at that time.

## 2012-09-01 NOTE — Patient Instructions (Addendum)
Report any further blood in the urine  Annual exam in May as scheduled

## 2013-02-18 ENCOUNTER — Other Ambulatory Visit (INDEPENDENT_AMBULATORY_CARE_PROVIDER_SITE_OTHER): Payer: 59

## 2013-02-18 DIAGNOSIS — Z0000 Encounter for general adult medical examination without abnormal findings: Secondary | ICD-10-CM

## 2013-02-18 LAB — CBC WITH DIFFERENTIAL/PLATELET
Basophils Absolute: 0 10*3/uL (ref 0.0–0.1)
Basophils Relative: 0.2 % (ref 0.0–3.0)
Eosinophils Absolute: 0.1 10*3/uL (ref 0.0–0.7)
Lymphocytes Relative: 19.8 % (ref 12.0–46.0)
MCHC: 33.5 g/dL (ref 30.0–36.0)
MCV: 88.6 fl (ref 78.0–100.0)
Monocytes Absolute: 0.3 10*3/uL (ref 0.1–1.0)
Neutrophils Relative %: 73.9 % (ref 43.0–77.0)
RBC: 4.69 Mil/uL (ref 4.22–5.81)
RDW: 13.2 % (ref 11.5–14.6)

## 2013-02-18 LAB — HEPATIC FUNCTION PANEL
ALT: 18 U/L (ref 0–53)
AST: 19 U/L (ref 0–37)
Albumin: 4.3 g/dL (ref 3.5–5.2)
Alkaline Phosphatase: 53 U/L (ref 39–117)

## 2013-02-18 LAB — POCT URINALYSIS DIPSTICK
Protein, UA: NEGATIVE
Spec Grav, UA: 1.025

## 2013-02-18 LAB — BASIC METABOLIC PANEL
BUN: 19 mg/dL (ref 6–23)
CO2: 28 mEq/L (ref 19–32)
Calcium: 9.6 mg/dL (ref 8.4–10.5)
Chloride: 105 mEq/L (ref 96–112)
Creatinine, Ser: 1.3 mg/dL (ref 0.4–1.5)
Glucose, Bld: 106 mg/dL — ABNORMAL HIGH (ref 70–99)

## 2013-02-18 LAB — LIPID PANEL
Cholesterol: 203 mg/dL — ABNORMAL HIGH (ref 0–200)
Total CHOL/HDL Ratio: 5
Triglycerides: 80 mg/dL (ref 0.0–149.0)
VLDL: 16 mg/dL (ref 0.0–40.0)

## 2013-02-25 ENCOUNTER — Ambulatory Visit (INDEPENDENT_AMBULATORY_CARE_PROVIDER_SITE_OTHER): Payer: 59 | Admitting: Internal Medicine

## 2013-02-25 ENCOUNTER — Encounter: Payer: Self-pay | Admitting: Internal Medicine

## 2013-02-25 VITALS — BP 130/70 | HR 70 | Temp 98.5°F | Resp 18 | Ht 67.0 in | Wt 169.0 lb

## 2013-02-25 DIAGNOSIS — M549 Dorsalgia, unspecified: Secondary | ICD-10-CM

## 2013-02-25 DIAGNOSIS — Z0000 Encounter for general adult medical examination without abnormal findings: Secondary | ICD-10-CM

## 2013-02-25 DIAGNOSIS — E785 Hyperlipidemia, unspecified: Secondary | ICD-10-CM

## 2013-02-25 MED ORDER — CYCLOBENZAPRINE HCL 10 MG PO TABS: 10.0000 mg | ORAL_TABLET | Freq: Three times a day (TID) | ORAL | Status: DC | PRN

## 2013-02-25 MED ORDER — DICLOFENAC SODIUM 75 MG PO TBEC: 75.0000 mg | DELAYED_RELEASE_TABLET | Freq: Two times a day (BID) | ORAL | Status: DC

## 2013-02-25 MED ORDER — TRAMADOL HCL 50 MG PO TABS: 50.0000 mg | ORAL_TABLET | Freq: Four times a day (QID) | ORAL | Status: DC | PRN

## 2013-02-25 NOTE — Progress Notes (Signed)
Subjective:    Patient ID: Derek Wong, male    DOB: Nov 27, 1960, 52 y.o.   MRN: [id number]  HPI 68 -year-old patient who is in today for a wellness exam. He takes no chronic medications. Laboratory studies were reviewed. His only complaint today is recurrence of some lumbar back pain. There is some radiation of the pain to the left leg. He's had this recent flare for about four-week period    Past Medical History  Diagnosis Date  . BACK PAIN 10/27/2008  . GYNECOMASTIA, UNILATERAL 07/07/2008  . HYPERLIPIDEMIA 10/23/2007  . OTALGIA 09/21/2007  . 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 Testosterone deficiency   . Head injury, closed     History   Social History  . Marital Status: Married    Spouse Name: N/A    Number of Children: N/A  . Years of Education: N/A   Occupational History  . Not on file.   Social History Main Topics  . Smoking status: Never Smoker   . Smokeless tobacco: Never Used  . Alcohol Use: No  . Drug Use: No  . Sexually Active: Not on file   Other Topics Concern  . Not on file   Social History Narrative  . No narrative on file    History reviewed. No pertinent past surgical history.  No family history on file.  No Known Allergies  No current outpatient prescriptions on file prior to visit.   No current facility-administered medications on file prior to visit.    BP 130/70  Pulse 70  Temp(Src) 98.5 F (36.9 C) (Oral)  Resp 18  Ht 5\' 7"  (1.702 m)  Wt 169 lb (76.658 kg)  BMI 26.46 kg/m2  SpO2 98%  Alcohol-Tobacco  Smoking Status: never  Allergies (verified):  No Known Drug Allergies   Past History:  Past Medical History:  Reviewed history from 11/18/2008 and no changes required.  Hyperlipidemia  remote history of malaria  history of closed head injury in 02/07/1989  gynecomastia  testosterone deficiency  History of positive PPD  Past Surgical History:  Reviewed history from 10/23/2007 and no changes required.   none   Family History:  Reviewed history from 10/23/2007 and no changes required.  details of his family history unclear,  live in [place]. Father  history of diabetes and malaria and died in February 08, 2011. Mother has a history of arthritis  4 brothers 3 sisters, good health  Social History:  Reviewed history from 10/23/2007 and no changes required.  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 presently seeking another job position resident in the states for approximately 19 years Smoking Status: never        Review of Systems  Constitutional: Negative for fever, chills, activity change, appetite change and fatigue.  HENT: Negative for hearing loss, ear pain, congestion, rhinorrhea, sneezing, mouth sores, trouble swallowing, neck pain, neck stiffness, dental problem, voice change, sinus pressure and tinnitus.   Eyes: Negative for photophobia, pain, redness and visual disturbance.  Respiratory: Positive for shortness of breath. Negative for apnea, cough, choking, chest tightness and wheezing.   Cardiovascular: Positive for chest pain. Negative for palpitations and leg swelling.  Gastrointestinal: Negative for nausea, vomiting, abdominal pain, diarrhea, constipation, blood in stool, abdominal distention, anal bleeding and rectal pain.  Genitourinary: Negative for dysuria, urgency, frequency, hematuria, flank pain, decreased urine volume, discharge, penile swelling, scrotal swelling, difficulty urinating, genital sores and testicular pain.  Musculoskeletal: Negative for myalgias, back pain, joint swelling, arthralgias and gait  problem.  Skin: Negative for color change, rash and wound.  Neurological: Negative for dizziness, tremors, seizures, syncope, facial asymmetry, speech difficulty, weakness, light-headedness, numbness and headaches.  Hematological: Negative for adenopathy. Does not bruise/bleed easily.  Psychiatric/Behavioral: Negative for suicidal ideas, hallucinations, behavioral problems,  confusion, sleep disturbance, self-injury, dysphoric mood, decreased concentration and agitation. The patient is not nervous/anxious.        Objective:   Physical Exam  Constitutional: He appears well-developed and well-nourished.  HENT:  Head: Normocephalic and atraumatic.  Right Ear: External ear normal.  Left Ear: External ear normal.  Nose: Nose normal.  Mouth/Throat: Oropharynx is clear and moist.  Eyes: Conjunctivae and EOM are normal. Pupils are equal, round, and reactive to light. No scleral icterus.  Neck: Normal range of motion. Neck supple. No JVD present. No thyromegaly present.  Cardiovascular: Regular rhythm, normal heart sounds and intact distal pulses.  Exam reveals no gallop and no friction rub.   No murmur heard. Pulmonary/Chest: Effort normal and breath sounds normal. He exhibits no tenderness.  Abdominal: Soft. Bowel sounds are normal. He exhibits no distension and no mass. There is no tenderness.  Genitourinary: Prostate normal and penis normal.  Musculoskeletal: Normal range of motion. He exhibits no edema and no tenderness.  Negative straight leg test  Lymphadenopathy:    He has no cervical adenopathy.  Neurological: He is alert. He has normal reflexes. No cranial nerve deficit. Coordination normal.  Skin: Skin is warm and dry. No rash noted.  Psychiatric: He has a normal mood and affect. His behavior is normal.          Assessment & Plan:    Preventive health examination. We'll encourage a screening colonoscopy. Apparently this was not performed last year. Laboratory studies were reviewed and these were unremarkable. We'll continue active lifestyle Exacerbation low back pain. Information dispensed. Will treat symptomatically

## 2013-02-25 NOTE — Patient Instructions (Addendum)
Most patients with low back pain will improve with time over the next two to 6 weeks.  Keep active but avoid any activities that cause pain.  Apply moist heat to the low back area several times daily.    Back Pain, Adult Low back pain is very common. About 1 in 5 people have back pain.The cause of low back pain is rarely dangerous. The pain often gets better over time.About half of people with a sudden onset of back pain feel better in just 2 weeks. About 8 in 10 people feel better by 6 weeks.  CAUSES Some common causes of back pain include:  Strain of the muscles or ligaments supporting the spine.  Wear and tear (degeneration) of the spinal discs.  Arthritis.  Direct injury to the back. DIAGNOSIS Most of the time, the direct cause of low back pain is not known.However, back pain can be treated effectively even when the exact cause of the pain is unknown.Answering your caregiver's questions about your overall health and symptoms is one of the most accurate ways to make sure the cause of your pain is not dangerous. If your caregiver needs more information, he or she may order lab work or imaging tests (X-rays or MRIs).However, even if imaging tests show changes in your back, this usually does not require surgery. HOME CARE INSTRUCTIONS For many people, back pain returns.Since low back pain is rarely dangerous, it is often a condition that people can learn to [facility] their own.   Remain active. It is stressful on the back to sit or stand in one place. Do not sit, drive, or stand in one place for more than 30 minutes at a time. Take short walks on level surfaces as soon as pain allows.Try to increase the length of time you walk each day.  Do not stay in bed.Resting more than 1 or 2 days can delay your recovery.  Do not avoid exercise or work.Your body is made to move.It is not dangerous to be active, even though your back may hurt.Your back will likely heal faster if you return to  being active before your pain is gone.  Pay attention to your body when you bend and lift. Many people have less discomfortwhen lifting if they bend their knees, keep the load close to their bodies,and avoid twisting. Often, the most comfortable positions are those that put less stress on your recovering back.  Find a comfortable position to sleep. Use a firm mattress and lie on your side with your knees slightly bent. If you lie on your back, put a pillow under your knees.  Only take over-the-counter or prescription medicines as directed by your caregiver. Over-the-counter medicines to reduce pain and inflammation are often the most helpful.Your caregiver may prescribe muscle relaxant drugs.These medicines help dull your pain so you can more quickly return to your normal activities and healthy exercise.  Put ice on the injured area.  Put ice in a plastic bag.  Place a towel between your skin and the bag.  Leave the ice on for 15-20 minutes, 3-4 times a day for the first 2 to 3 days. After that, ice and heat may be alternated to reduce pain and spasms.  Ask your caregiver about trying back exercises and gentle massage. This may be of some benefit.  Avoid feeling anxious or stressed.Stress increases muscle tension and can worsen back pain.It is important to recognize when you are anxious or stressed and learn ways to manage it.Exercise is a  great option. SEEK MEDICAL CARE IF:  You have pain that is not relieved with rest or medicine.  You have pain that does not improve in 1 week.  You have new symptoms.  You are generally not feeling well. SEEK IMMEDIATE MEDICAL CARE IF:   You have pain that radiates from your back into your legs.  You develop new bowel or bladder control problems.  You have unusual weakness or numbness in your arms or legs.  You develop nausea or vomiting.  You develop abdominal pain.  You feel faint. Document Released: 08/19/2005 Document Revised:  02/18/2012 Document Reviewed: 01/07/2011 [facility] Patient Information 2014 [place], [place]. Back Exercises These exercises may help you when beginning to rehabilitate your injury. Your symptoms may resolve with or without further involvement from your physician, physical therapist or athletic trainer. While completing these exercises, remember:   Restoring tissue flexibility helps normal motion to return to the joints. This allows healthier, less painful movement and activity.  An effective stretch should be held for at least 30 seconds.  A stretch should never be painful. You should only feel a gentle lengthening or release in the stretched tissue. STRETCH  Extension, Prone on Elbows   Lie on your stomach on the floor, a bed will be too soft. Place your palms about shoulder width apart and at the height of your head.  Place your elbows under your shoulders. If this is too painful, stack pillows under your chest.  Allow your body to relax so that your hips drop lower and make contact more completely with the floor.  Hold this position for __________ seconds.  Slowly return to lying flat on the floor. Repeat __________ times. Complete this exercise __________ times per day.  RANGE OF MOTION  Extension, Prone Press Ups   Lie on your stomach on the floor, a bed will be too soft. Place your palms about shoulder width apart and at the height of your head.  Keeping your back as relaxed as possible, slowly straighten your elbows while keeping your hips on the floor. You may adjust the placement of your hands to maximize your comfort. As you gain motion, your hands will come more underneath your shoulders.  Hold this position __________ seconds.  Slowly return to lying flat on the floor. Repeat __________ times. Complete this exercise __________ times per day.  RANGE OF MOTION- Quadruped, Neutral Spine   Assume a hands and knees position on a firm surface. Keep your hands under your  shoulders and your knees under your hips. You may place padding under your knees for comfort.  Drop your head and point your tail bone toward the ground below you. This will round out your low back like an angry cat. Hold this position for __________ seconds.  Slowly lift your head and release your tail bone so that your back sags into a large arch, like an old horse.  Hold this position for __________ seconds.  Repeat this until you feel limber in your low back.  Now, find your "sweet spot." This will be the most comfortable position somewhere between the two previous positions. This is your neutral spine. Once you have found this position, tense your stomach muscles to support your low back.  Hold this position for __________ seconds. Repeat __________ times. Complete this exercise __________ times per day.  STRETCH  Flexion, Single Knee to Chest   Lie on a firm bed or floor with both legs extended in front of you.  Keeping one  leg in contact with the floor, bring your opposite knee to your chest. Hold your leg in place by either grabbing behind your thigh or at your knee.  Pull until you feel a gentle stretch in your low back. Hold __________ seconds.  Slowly release your grasp and repeat the exercise with the opposite side. Repeat __________ times. Complete this exercise __________ times per day.  STRETCH - Hamstrings, Standing  Stand or sit and extend your right / left leg, placing your foot on a chair or foot stool  Keeping a slight arch in your low back and your hips straight forward.  Lead with your chest and lean forward at the waist until you feel a gentle stretch in the back of your right / left knee or thigh. (When done correctly, this exercise requires leaning only a small distance.)  Hold this position for __________ seconds. Repeat __________ times. Complete this stretch __________ times per day. STRENGTHENING  Deep Abdominals, Pelvic Tilt   Lie on a firm bed or  floor. Keeping your legs in front of you, bend your knees so they are both pointed toward the ceiling and your feet are flat on the floor.  Tense your lower abdominal muscles to press your low back into the floor. This motion will rotate your pelvis so that your tail bone is scooping upwards rather than pointing at your feet or into the floor.  With a gentle tension and even breathing, hold this position for __________ seconds. Repeat __________ times. Complete this exercise __________ times per day.  STRENGTHENING  Abdominals, Crunches   Lie on a firm bed or floor. Keeping your legs in front of you, bend your knees so they are both pointed toward the ceiling and your feet are flat on the floor. Cross your arms over your chest.  Slightly tip your chin down without bending your neck.  Tense your abdominals and slowly lift your trunk high enough to just clear your shoulder blades. Lifting higher can put excessive stress on the low back and does not further strengthen your abdominal muscles.  Control your return to the starting position. Repeat __________ times. Complete this exercise __________ times per day.  STRENGTHENING  Quadruped, Opposite UE/LE Lift   Assume a hands and knees position on a firm surface. Keep your hands under your shoulders and your knees under your hips. You may place padding under your knees for comfort.  Find your neutral spine and gently tense your abdominal muscles so that you can maintain this position. Your shoulders and hips should form a rectangle that is parallel with the floor and is not twisted.  Keeping your trunk steady, lift your right hand no higher than your shoulder and then your left leg no higher than your hip. Make sure you are not holding your breath. Hold this position __________ seconds.  Continuing to keep your abdominal muscles tense and your back steady, slowly return to your starting position. Repeat with the opposite arm and leg. Repeat  __________ times. Complete this exercise __________ times per day. Document Released: 09/06/2005 Document Revised: 11/11/2011 Document Reviewed: 12/01/2008 [facility] Patient Information 2014 [place], [place].   Schedule your colonoscopy to help detect colon cancer.

## 2013-03-03 ENCOUNTER — Encounter: Payer: Self-pay | Admitting: Gastroenterology

## 2013-04-08 IMAGING — CR DG ANKLE COMPLETE 3+V*L*
3 series · 3 of 3 positions shown · non-contrast
Comparison: None.

CLINICAL DATA: Pain in left ankle for 3 weeks.  No known injury.

LEFT ANKLE COMPLETE - 3+ VIEW

[t ankle joint ap left]
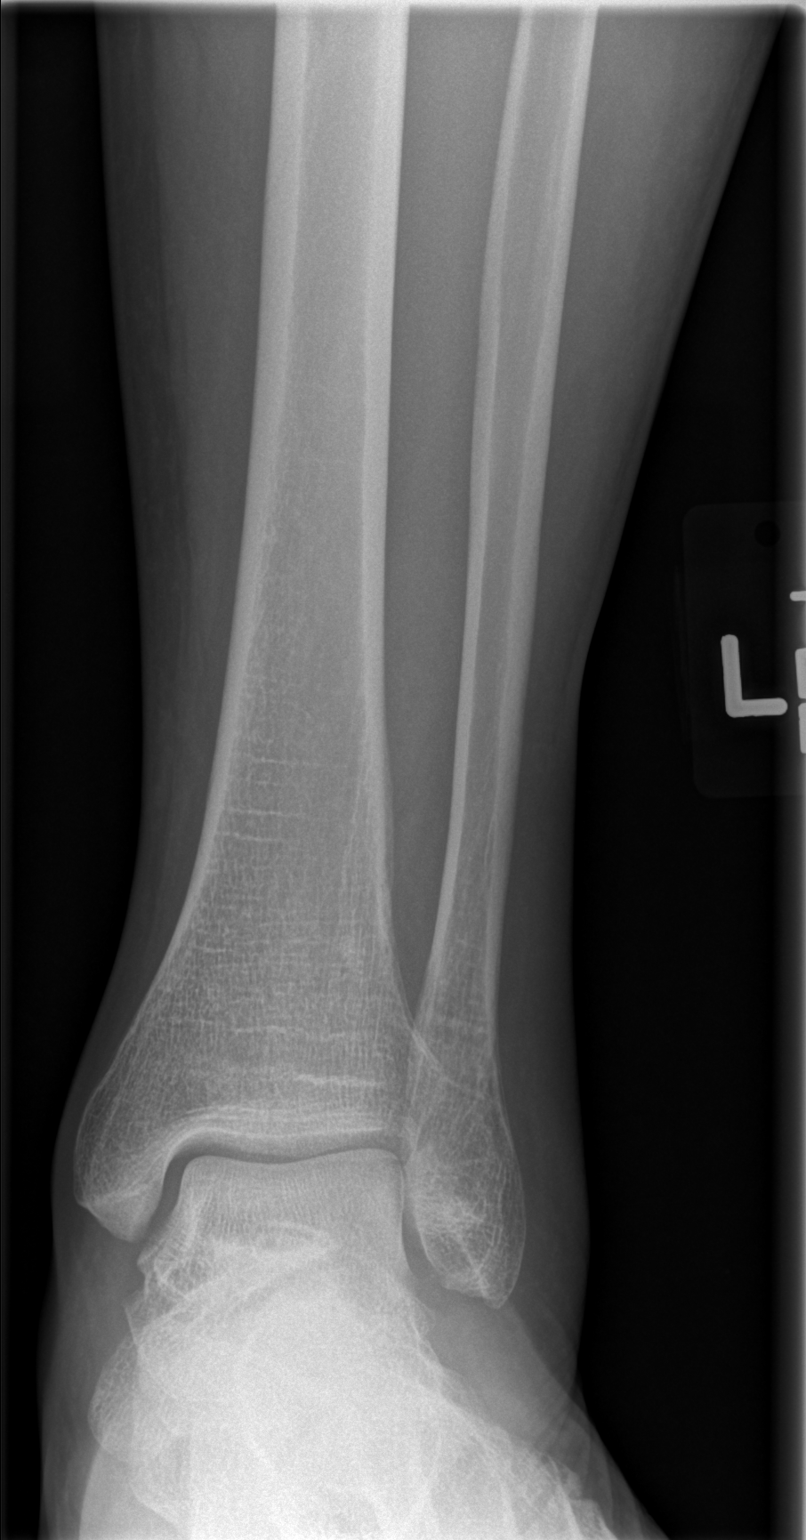

[t ankle joint oblique left]
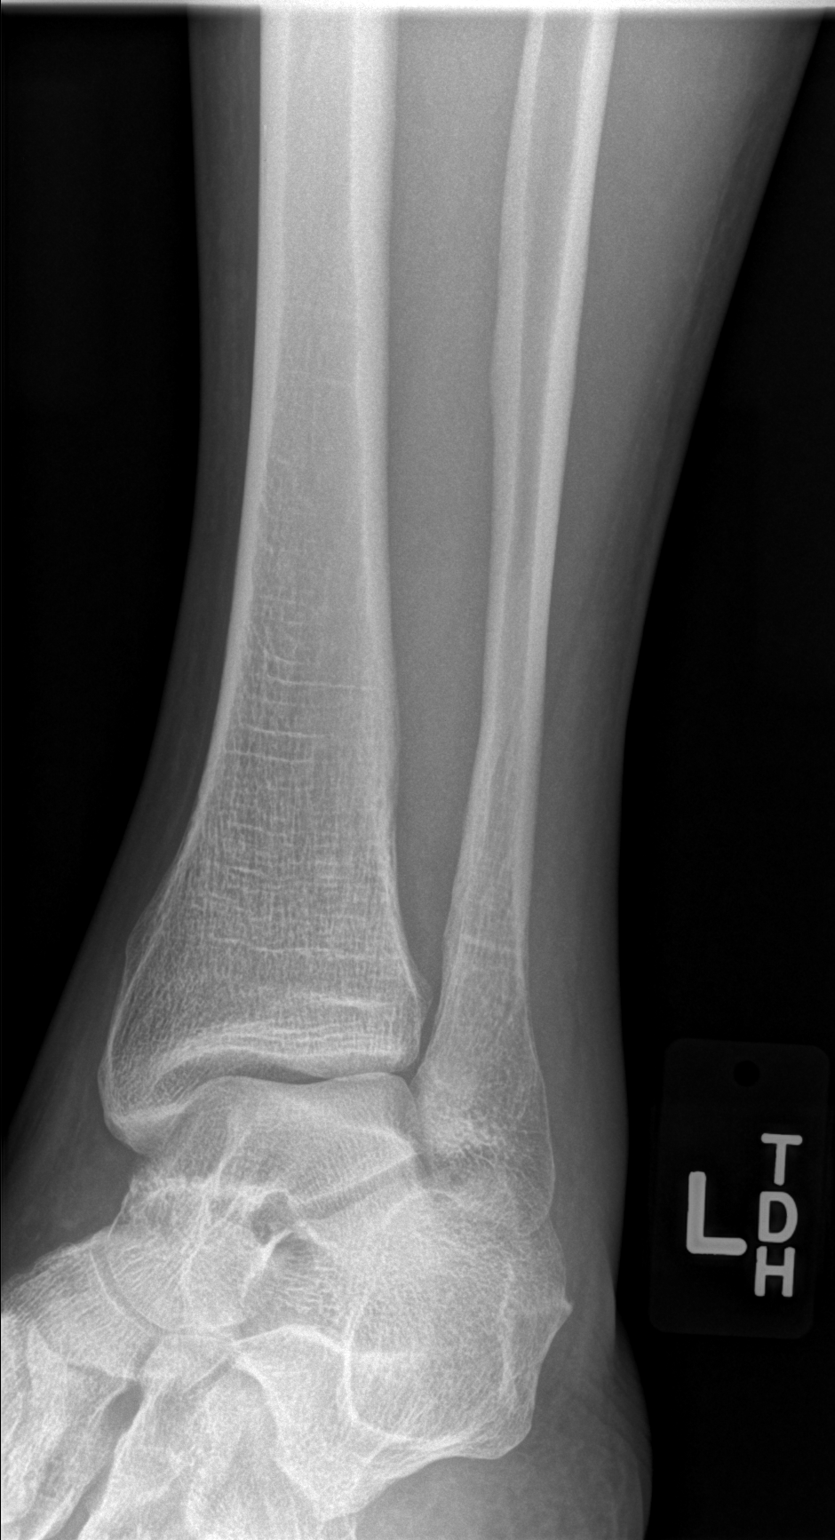

[t ankle joint lat left *]
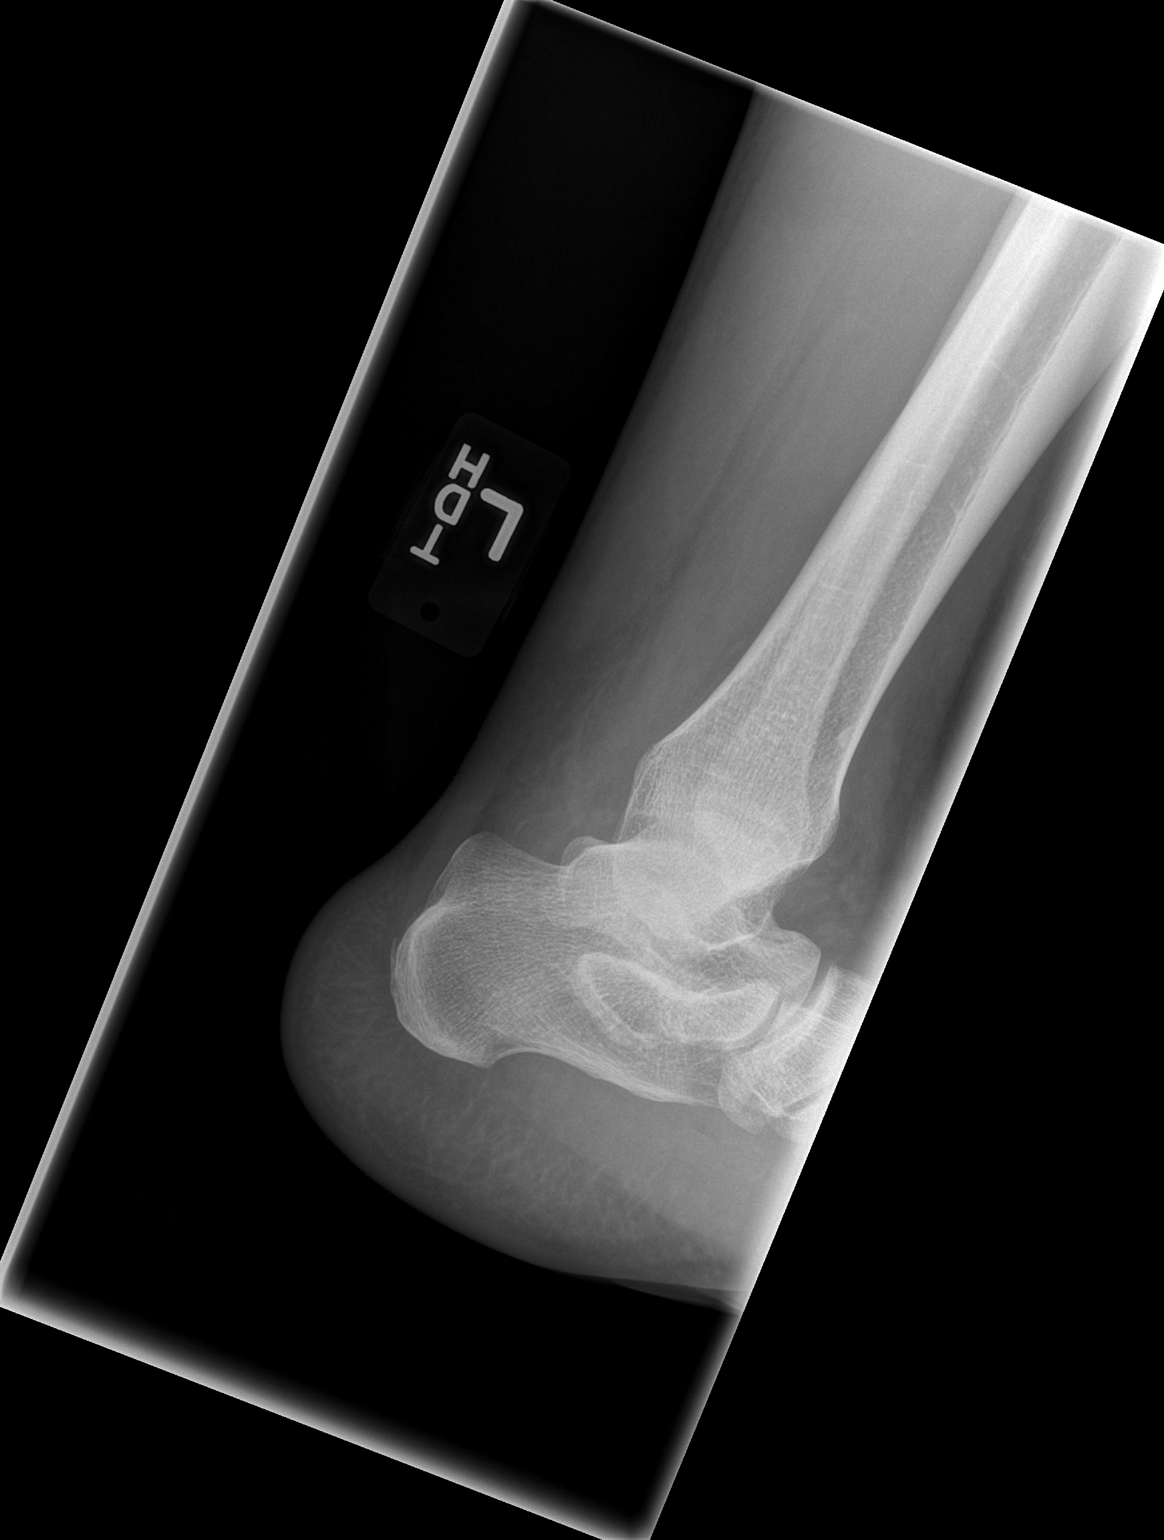

[3 of 3 positions shown; findings below may reference images not displayed]

FINDINGS: Soft tissue swelling laterally.  No fracture or
dislocation.
IMPRESSION: As above.

## 2013-05-04 ENCOUNTER — Ambulatory Visit (AMBULATORY_SURGERY_CENTER): Payer: 59

## 2013-05-04 VITALS — Ht 66.5 in | Wt 166.2 lb

## 2013-05-04 DIAGNOSIS — Z1211 Encounter for screening for malignant neoplasm of colon: Secondary | ICD-10-CM

## 2013-05-04 MED ORDER — SUPREP BOWEL PREP KIT 17.5-3.13-1.6 GM/177ML PO SOLN: 1.0000 | Freq: Once | ORAL | Status: DC

## 2013-05-06 ENCOUNTER — Encounter: Payer: 59 | Admitting: Gastroenterology

## 2013-05-17 ENCOUNTER — Encounter: Payer: Self-pay | Admitting: Gastroenterology

## 2013-05-17 ENCOUNTER — Ambulatory Visit (AMBULATORY_SURGERY_CENTER): Payer: 59 | Admitting: Gastroenterology

## 2013-05-17 VITALS — BP 140/77 | HR 60 | Temp 97.0°F | Resp 21 | Ht 66.0 in | Wt 166.0 lb

## 2013-05-17 DIAGNOSIS — Z1211 Encounter for screening for malignant neoplasm of colon: Secondary | ICD-10-CM

## 2013-05-17 MED ORDER — SODIUM CHLORIDE 0.9 % IV SOLN: 500.0000 mL | INTRAVENOUS | Status: DC

## 2013-05-17 NOTE — Progress Notes (Signed)
Procedure ends, to recovery, report given and VSS. 

## 2013-05-17 NOTE — Patient Instructions (Signed)
Normal colon exam today. Repeat colonoscopy in 10 years. Resume current medications. Call us with any questions or concerns. Thank you!!  YOU HAD AN ENDOSCOPIC PROCEDURE TODAY AT THE [facility]: Refer to the procedure report that was given to you for any specific questions about what was found during the examination.  If the procedure report does not answer your questions, please call your gastroenterologist to clarify.  If you requested that your care partner not be given the details of your procedure findings, then the procedure report has been included in a sealed envelope for you to review at your convenience later.  YOU SHOULD EXPECT: Some feelings of bloating in the abdomen. Passage of more gas than usual.  Walking can help get rid of the air that was put into your GI tract during the procedure and reduce the bloating. If you had a lower endoscopy (such as a colonoscopy or flexible sigmoidoscopy) you may notice spotting of blood in your stool or on the toilet paper. If you underwent a bowel prep for your procedure, then you may not have a normal bowel movement for a few days.  DIET: Your first meal following the procedure should be a light meal and then it is ok to progress to your normal diet.  A half-sandwich or bowl of soup is an example of a good first meal.  Heavy or fried foods are harder to digest and may make you feel nauseous or bloated.  Likewise meals heavy in dairy and vegetables can cause extra gas to form and this can also increase the bloating.  Drink plenty of fluids but you should avoid alcoholic beverages for 24 hours.  ACTIVITY: Your care partner should take you home directly after the procedure.  You should plan to take it easy, moving slowly for the rest of the day.  You can resume normal activity the day after the procedure however you should NOT DRIVE or use heavy machinery for 24 hours (because of the sedation medicines used during the test).    SYMPTOMS TO  REPORT IMMEDIATELY: A gastroenterologist can be reached at any hour.  During normal business hours, 8:30 AM to 5:00 PM Monday through Friday, call [phone].  After hours and on weekends, please call the GI answering service at [phone] who will take a message and have the physician on call contact you.   Following lower endoscopy (colonoscopy or flexible sigmoidoscopy):  Excessive amounts of blood in the stool  Significant tenderness or worsening of abdominal pains  Swelling of the abdomen that is new, acute  Fever of 100F or higher  Following upper endoscopy (EGD)  Vomiting of blood or coffee ground material  New chest pain or pain under the shoulder blades  Painful or persistently difficult swallowing  New shortness of breath  Fever of 100F or higher  Black, tarry-looking stools  FOLLOW UP: If any biopsies were taken you will be contacted by phone or by letter within the next 1-3 weeks.  Call your gastroenterologist if you have not heard about the biopsies in 3 weeks.  Our staff will call the home number listed on your records the next business day following your procedure to check on you and address any questions or concerns that you may have at that time regarding the information given to you following your procedure. This is a courtesy call and so if there is no answer at the home number and we have not heard from you through the emergency physician   on call, we will assume that you have returned to your regular daily activities without incident.  SIGNATURES/CONFIDENTIALITY: You and/or your care partner have signed paperwork which will be entered into your electronic medical record.  These signatures attest to the fact that that the information above on your After Visit Summary has been reviewed and is understood.  Full responsibility of the confidentiality of this discharge information lies with you and/or your care-partner. 

## 2013-05-17 NOTE — Op Note (Signed)
[facility] [address] [place], [address]   COLONOSCOPY PROCEDURE REPORT  PATIENT: Derek Wong, Derek Wong  MR#: [id number] BIRTHDATE: 1960-11-07 , 51  yrs. old GENDER: Male ENDOSCOPIST: Louis Meckel, MD REFERRED [id number] Amador Cunas, M.D. PROCEDURE DATE:  05/17/2013 PROCEDURE:   Colonoscopy, diagnostic First Screening Colonoscopy - Avg.  risk and is 50 yrs.  old or older Yes.  Prior Negative Screening - Now for repeat screening. N/A  History of Adenoma - Now for follow-up colonoscopy & has been > or = to 3 yrs.  N/A ASA CLASS:   Class II INDICATIONS:average risk screening. MEDICATIONS: MAC sedation, administered by CRNA and Propofol (Diprivan) 220 mg IV  DESCRIPTION OF PROCEDURE:   After the risks benefits and alternatives of the procedure were thoroughly explained, informed consent was obtained.  A digital rectal exam revealed no abnormalities of the rectum.   The LB [id number]  endoscope was introduced through the anus and advanced to the terminal ileum which was intubated for a short distance. No adverse events experienced.   The quality of the prep was excellent using Suprep The instrument was then slowly withdrawn as the colon was fully examined.      COLON FINDINGS: A normal appearing cecum, ileocecal valve, and appendiceal orifice were identified.  The ascending, hepatic flexure, transverse, splenic flexure, descending, sigmoid colon and rectum appeared unremarkable.  No polyps or cancers were seen. The mucosa appeared normal in the terminal ileum.  Retroflexed views revealed no abnormalities. The time to cecum=3 minutes 43 seconds.  Withdrawal time=7 minutes 57 seconds.  The scope was withdrawn and the procedure completed. COMPLICATIONS: There were no complications.  ENDOSCOPIC IMPRESSION: 1.   Normal colon 2.   Normal mucosa in the terminal ileum  RECOMMENDATIONS: Continue current colorectal screening recommendations for  "routine risk" patients with a repeat colonoscopy in 10 years.   eSigned:  Louis Meckel, MD 05/17/2013 4:16 PM   cc:   PATIENT NAME:  [name], [name] MR#: [id number]

## 2013-05-17 NOTE — Progress Notes (Signed)
Patient did not experience any of the following events: a burn prior to discharge; a fall within the facility; wrong site/side/patient/procedure/implant event; or a hospital transfer or hospital admission upon discharge from the facility. (G8907) Patient did not have preoperative order for IV antibiotic SSI prophylaxis. (G8918)  

## 2013-05-18 ENCOUNTER — Telehealth: Payer: Self-pay

## 2013-05-18 NOTE — Telephone Encounter (Signed)
  Follow up Call-  Call back number 05/17/2013  Post procedure Call Back phone  # [phone]  Permission to leave phone message Yes     Patient questions:  Do you have a fever, pain , or abdominal swelling? no Pain Score  0 *  Have you tolerated food without any problems? yes  Have you been able to return to your normal activities? yes  Do you have any questions about your discharge instructions: Diet   no Medications  no Follow up visit  no  Do you have questions or concerns about your Care? no  Actions: * If pain score is 4 or above: No action needed, pain <4.

## 2013-07-08 ENCOUNTER — Other Ambulatory Visit: Payer: Self-pay

## 2014-04-01 ENCOUNTER — Other Ambulatory Visit (INDEPENDENT_AMBULATORY_CARE_PROVIDER_SITE_OTHER): Payer: 59

## 2014-04-01 DIAGNOSIS — Z0000 Encounter for general adult medical examination without abnormal findings: Secondary | ICD-10-CM

## 2014-04-01 LAB — LIPID PANEL
CHOLESTEROL: 215 mg/dL — AB (ref 0–200)
HDL: 40.2 mg/dL (ref >39.00)
LDL Cholesterol: 156 mg/dL — ABNORMAL HIGH (ref 0–99)
NONHDL: 174.8
Total CHOL/HDL Ratio: 5
Triglycerides: 94 mg/dL (ref 0.0–149.0)
VLDL: 18.8 mg/dL (ref 0.0–40.0)

## 2014-04-01 LAB — CBC WITH DIFFERENTIAL/PLATELET
BASOS PCT: 0.7 % (ref 0.0–3.0)
Basophils Absolute: 0 10*3/uL (ref 0.0–0.1)
EOS PCT: 2.9 % (ref 0.0–5.0)
Eosinophils Absolute: 0.1 10*3/uL (ref 0.0–0.7)
HCT: 40.3 % (ref 39.0–52.0)
Hemoglobin: 13.7 g/dL (ref 13.0–17.0)
LYMPHS PCT: 43.7 % (ref 12.0–46.0)
Lymphs Abs: 1.8 10*3/uL (ref 0.7–4.0)
MCHC: 34 g/dL (ref 30.0–36.0)
MCV: 86.7 fl (ref 78.0–100.0)
Monocytes Absolute: 0.2 10*3/uL (ref 0.1–1.0)
Monocytes Relative: 6.1 % (ref 3.0–12.0)
NEUTROS PCT: 46.6 % (ref 43.0–77.0)
Neutro Abs: 1.9 10*3/uL (ref 1.4–7.7)
Platelets: 191 10*3/uL (ref 150.0–400.0)
RBC: 4.65 Mil/uL (ref 4.22–5.81)
RDW: 13.1 % (ref 11.5–15.5)
WBC: 4 10*3/uL (ref 4.0–10.5)

## 2014-04-01 LAB — BASIC METABOLIC PANEL
BUN: 15 mg/dL (ref 6–23)
CALCIUM: 9.2 mg/dL (ref 8.4–10.5)
CHLORIDE: 107 meq/L (ref 96–112)
CO2: 28 mEq/L (ref 19–32)
CREATININE: 1.4 mg/dL (ref 0.4–1.5)
GFR: 70.01 mL/min (ref >60.00)
Glucose, Bld: 98 mg/dL (ref 70–99)
Potassium: 3.9 mEq/L (ref 3.5–5.1)
Sodium: 139 mEq/L (ref 135–145)

## 2014-04-01 LAB — HEPATIC FUNCTION PANEL
ALK PHOS: 51 U/L (ref 39–117)
ALT: 16 U/L (ref 0–53)
AST: 21 U/L (ref 0–37)
Albumin: 4.3 g/dL (ref 3.5–5.2)
BILIRUBIN DIRECT: 0.1 mg/dL (ref 0.0–0.3)
TOTAL PROTEIN: 6.9 g/dL (ref 6.0–8.3)
Total Bilirubin: 0.7 mg/dL (ref 0.2–1.2)

## 2014-04-01 LAB — POCT URINALYSIS DIPSTICK
BILIRUBIN UA: NEGATIVE
Glucose, UA: NEGATIVE
KETONES UA: NEGATIVE
LEUKOCYTES UA: NEGATIVE
Nitrite, UA: NEGATIVE
Spec Grav, UA: 1.015
Urobilinogen, UA: 0.2
pH, UA: 8.5

## 2014-04-01 LAB — TSH: TSH: 1.67 u[IU]/mL (ref 0.35–4.50)

## 2014-04-01 LAB — PSA: PSA: 0.38 ng/mL (ref 0.10–4.00)

## 2014-04-08 ENCOUNTER — Ambulatory Visit (INDEPENDENT_AMBULATORY_CARE_PROVIDER_SITE_OTHER): Payer: 59 | Admitting: Internal Medicine

## 2014-04-08 ENCOUNTER — Encounter: Payer: Self-pay | Admitting: Internal Medicine

## 2014-04-08 VITALS — BP 140/90 | HR 59 | Temp 98.0°F | Resp 18 | Ht 66.5 in | Wt 170.0 lb

## 2014-04-08 DIAGNOSIS — E785 Hyperlipidemia, unspecified: Secondary | ICD-10-CM

## 2014-04-08 NOTE — Patient Instructions (Signed)
Cardiac Diet This diet can help prevent heart disease and stroke. Many factors influence your heart health, including eating and exercise habits. Coronary risk rises a lot with abnormal blood fat (lipid) levels. Cardiac meal planning includes limiting unhealthy fats, increasing healthy fats, and making other small dietary changes. General guidelines are as follows:  Adjust calorie intake to reach and maintain desirable body weight.  Limit total fat intake to less than 30% of total calories. Saturated fat should be less than 7% of calories.  Saturated fats are found in animal products and in some vegetable products. Saturated vegetable fats are found in coconut oil, cocoa butter, palm oil, and palm kernel oil. Read labels carefully to avoid these products as much as possible. Use butter in moderation. Choose tub margarines and oils that have 2 grams of fat or less. Good cooking oils are canola and olive oils.  Practice low-fat cooking techniques. Do not fry food. Instead, broil, bake, boil, steam, grill, roast on a rack, stir-fry, or microwave it. Other fat reducing suggestions include:  Remove the skin from poultry.  Remove all visible fat from meats.  Skim the fat off stews, soups, and gravies before serving them.  Steam vegetables in water or broth instead of sauting them in fat.  Avoid foods with trans fat (or hydrogenated oils), such as commercially fried foods and commercially baked goods. Commercial shortening and deep-frying fats will contain trans fat.  Increase intake of fruits, vegetables, whole grains, and legumes to replace foods high in fat.  Increase consumption of nuts, legumes, and seeds to at least 4 servings weekly. One serving of a legume equals  cup, and 1 serving of nuts or seeds equals  cup.  Choose whole grains more often. Have 3 servings per day (a serving is 1 ounce [oz]).  Eat 4 to 5 servings of vegetables per day. A serving of vegetables is 1 cup of raw leafy  vegetables;  cup of raw or cooked cut-up vegetables;  cup of vegetable juice.  Eat 4 to 5 servings of fruit per day. A serving of fruit is 1 medium whole fruit;  cup of dried fruit;  cup of fresh, frozen, or canned fruit;  cup of 100% fruit juice.  Increase your intake of dietary fiber to 20 to 30 grams per day. Insoluble fiber may help lower your risk of heart disease and may help curb your appetite.  Soluble fiber binds cholesterol to be removed from the blood. Foods high in soluble fiber are dried beans, citrus fruits, oats, apples, bananas, broccoli, Brussels sprouts, and eggplant.  Try to include foods fortified with plant sterols or stanols, such as yogurt, breads, juices, or margarines. Choose several fortified foods to achieve a daily intake of 2 to 3 grams of plant sterols or stanols.  Foods with omega-3 fats can help reduce your risk of heart disease. Aim to have a 3.5 oz portion of fatty fish twice per week, such as salmon, mackerel, albacore tuna, sardines, lake trout, or herring. If you wish to take a fish oil supplement, choose one that contains 1 gram of both DHA and EPA.  Limit processed meats to 2 servings (3 oz portion) weekly.  Limit the sodium in your diet to 1500 milligrams (mg) per day. If you have high blood pressure, talk to a registered dietitian about a DASH (Dietary Approaches to Stop Hypertension) eating plan.  Limit sweets and beverages with added sugar, such as soda, to no more than 5 servings per week. One   serving is:   1 tablespoon sugar.  1 tablespoon jelly or jam.   cup sorbet.  1 cup lemonade.   cup regular soda. CHOOSING FOODS Starches  Allowed: Breads: All kinds (wheat, rye, raisin, white, oatmeal, Italian, French, and English muffin bread). Low-fat rolls: English muffins, frankfurter and hamburger buns, bagels, pita bread, tortillas (not fried). Pancakes, waffles, biscuits, and muffins made with recommended oil.  Avoid: Products made with  saturated or trans fats, oils, or whole milk products. Butter rolls, cheese breads, croissants. Commercial doughnuts, muffins, sweet rolls, biscuits, waffles, pancakes, store-bought mixes. Crackers  Allowed: Low-fat crackers and snacks: Animal, graham, rye, saltine (with recommended oil, no lard), oyster, and matzo crackers. Bread sticks, melba toast, rusks, flatbread, pretzels, and light popcorn.  Avoid: High-fat crackers: cheese crackers, butter crackers, and those made with coconut, palm oil, or trans fat (hydrogenated oils). Buttered popcorn. Cereals  Allowed: Hot or cold whole-grain cereals.  Avoid: Cereals containing coconut, hydrogenated vegetable fat, or animal fat. Potatoes / Pasta / Rice  Allowed: All kinds of potatoes, rice, and pasta (such as macaroni, spaghetti, and noodles).  Avoid: Pasta or rice prepared with cream sauce or high-fat cheese. Chow mein noodles, French fries. Vegetables  Allowed: All vegetables and vegetable juices.  Avoid: Fried vegetables. Vegetables in cream, butter, or high-fat cheese sauces. Limit coconut. Fruit in cream or custard. Protein  Allowed: Limit your intake of meat, seafood, and poultry to no more than 6 oz (cooked weight) per day. All lean, well-trimmed beef, veal, pork, and lamb. All chicken and turkey without skin. All fish and shellfish. Wild game: wild duck, rabbit, pheasant, and venison. Egg whites or low-cholesterol egg substitutes may be used as desired. Meatless dishes: recipes with dried beans, peas, lentils, and tofu (soybean curd). Seeds and nuts: all seeds and most nuts.  Avoid: Prime grade and other heavily marbled and fatty meats, such as short ribs, spare ribs, rib eye roast or steak, frankfurters, sausage, bacon, and high-fat luncheon meats, mutton. Caviar. Commercially fried fish. Domestic duck, goose, venison sausage. Organ meats: liver, gizzard, heart, chitterlings, brains, kidney, sweetbreads. Dairy  Allowed: Low-fat  cheeses: nonfat or low-fat cottage cheese (1% or 2% fat), cheeses made with part skim milk, such as mozzarella, farmers, string, or ricotta. (Cheeses should be labeled no more than 2 to 6 grams fat per oz.). Skim (or 1%) milk: liquid, powdered, or evaporated. Buttermilk made with low-fat milk. Drinks made with skim or low-fat milk or cocoa. Chocolate milk or cocoa made with skim or low-fat (1%) milk. Nonfat or low-fat yogurt.  Avoid: Whole milk cheeses, including colby, cheddar, muenster, Monterey Jack, Havarti, Brie, Camembert, American, Swiss, and blue. Creamed cottage cheese, cream cheese. Whole milk and whole milk products, including buttermilk or yogurt made from whole milk, drinks made from whole milk. Condensed milk, evaporated whole milk, and 2% milk. Soups and Combination Foods  Allowed: Low-fat low-sodium soups: broth, dehydrated soups, homemade broth, soups with the fat removed, homemade cream soups made with skim or low-fat milk. Low-fat spaghetti, lasagna, chili, and Spanish rice if low-fat ingredients and low-fat cooking techniques are used.  Avoid: Cream soups made with whole milk, cream, or high-fat cheese. All other soups. Desserts and Sweets  Allowed: Sherbet, fruit ices, gelatins, meringues, and angel food cake. Homemade desserts with recommended fats, oils, and milk products. Jam, jelly, honey, marmalade, sugars, and syrups. Pure sugar candy, such as gum drops, hard candy, jelly beans, marshmallows, mints, and small amounts of dark chocolate.  Avoid: Commercially prepared   cakes, pies, cookies, frosting, pudding, or mixes for these products. Desserts containing whole milk products, chocolate, coconut, lard, palm oil, or palm kernel oil. Ice cream or ice cream drinks. Candy that contains chocolate, coconut, butter, hydrogenated fat, or unknown ingredients. Buttered syrups. Fats and Oils  Allowed: Vegetable oils: safflower, sunflower, corn, soybean, cottonseed, sesame, canola, olive,  or peanut. Non-hydrogenated margarines. Salad dressing or mayonnaise: homemade or commercial, made with a recommended oil. Low or nonfat salad dressing or mayonnaise.  Limit added fats and oils to 6 to 8 tsp per day (includes fats used in cooking, baking, salads, and spreads on bread). Remember to count the "hidden fats" in foods.  Avoid: Solid fats and shortenings: butter, lard, salt pork, bacon drippings. Gravy containing meat fat, shortening, or suet. Cocoa butter, coconut. Coconut oil, palm oil, palm kernel oil, or hydrogenated oils: these ingredients are often used in bakery products, nondairy creamers, whipped toppings, candy, and commercially fried foods. Read labels carefully. Salad dressings made of unknown oils, sour cream, or cheese, such as blue cheese and Roquefort. Cream, all kinds: half-and-half, light, heavy, or whipping. Sour cream or cream cheese (even if "light" or low-fat). Nondairy cream substitutes: coffee creamers and sour cream substitutes made with palm, palm kernel, hydrogenated oils, or coconut oil. Beverages  Allowed: Coffee (regular or decaffeinated), tea. Diet carbonated beverages, mineral water. Alcohol: Check with your caregiver. Moderation is recommended.  Avoid: Whole milk, regular sodas, and juice drinks with added sugar. Condiments  Allowed: All seasonings and condiments. Cocoa powder. "Cream" sauces made with recommended ingredients.  Avoid: Carob powder made with hydrogenated fats. SAMPLE MENU Breakfast   cup orange juice   cup oatmeal  1 slice toast  1 tsp margarine  1 cup skim milk Lunch  Turkey sandwich with 2 oz turkey, 2 slices bread  Lettuce and tomato slices  Fresh fruit  Carrot sticks  Coffee or tea Snack  Fresh fruit or low-fat crackers Dinner  3 oz lean ground beef  1 baked potato  1 tsp margarine   cup asparagus  Lettuce salad  1 tbs non-creamy dressing   cup peach slices  1 cup skim milk Document Released:  05/28/2008 Document Revised: 02/18/2012 Document Reviewed: 10/19/2013 Exit[employer] Patient Information 2015 [employer]. This information is not intended to replace advice given to you by your health care provider. Make sure you discuss any questions you have with your health care provider. Health Maintenance A healthy lifestyle and preventative care can promote health and wellness.  Maintain regular health, dental, and eye exams.  Eat a healthy diet. Foods like vegetables, fruits, whole grains, low-fat dairy products, and lean protein foods contain the nutrients you need and are low in calories. Decrease your intake of foods high in solid fats, added sugars, and salt. Get information about a proper diet from your health care provider, if necessary.  Regular physical exercise is one of the most important things you can do for your health. Most adults should get at least 150 minutes of moderate-intensity exercise (any activity that increases your heart rate and causes you to sweat) each week. In addition, most adults need muscle-strengthening exercises on 2 or more days a week.   Maintain a healthy weight. The body mass index (BMI) is a screening tool to identify possible weight problems. It provides an estimate of body fat based on height and weight. Your health care provider can find your BMI and can help you achieve or maintain a healthy weight. For males 20 years   and older:  A BMI below 18.5 is considered underweight.  A BMI of 18.5 to 24.9 is normal.  A BMI of 25 to 29.9 is considered overweight.  A BMI of 30 and above is considered obese.  Maintain normal blood lipids and cholesterol by exercising and minimizing your intake of saturated fat. Eat a balanced diet with plenty of fruits and vegetables. Blood tests for lipids and cholesterol should begin at age 20 and be repeated every 5 years. If your lipid or cholesterol levels are high, you are over age 50, or you are at high risk for heart  disease, you may need your cholesterol levels checked more frequently.Ongoing high lipid and cholesterol levels should be treated with medicines if diet and exercise are not working.  If you smoke, find out from your health care provider how to quit. If you do not use tobacco, do not start.  Lung cancer screening is recommended for adults aged 55-80 years who are at high risk for developing lung cancer because of a history of smoking. A yearly low-dose CT scan of the lungs is recommended for people who have at least a 30-pack-year history of smoking and are current smokers or have quit within the past 15 years. A pack year of smoking is smoking an average of 1 pack of cigarettes a day for 1 year (for example, a 30-pack-year history of smoking could mean smoking 1 pack a day for 30 years or 2 packs a day for 15 years). Yearly screening should continue until the smoker has stopped smoking for at least 15 years. Yearly screening should be stopped for people who develop a health problem that would prevent them from having lung cancer treatment.  If you choose to drink alcohol, do not have more than 2 drinks per day. One drink is considered to be 12 oz (360 mL) of beer, 5 oz (150 mL) of wine, or 1.5 oz (45 mL) of liquor.  Avoid the use of street drugs. Do not share needles with anyone. Ask for help if you need support or instructions about stopping the use of drugs.  High blood pressure causes heart disease and increases the risk of stroke. Blood pressure should be checked at least every 1-2 years. Ongoing high blood pressure should be treated with medicines if weight loss and exercise are not effective.  If you are 45-79 years old, ask your health care provider if you should take aspirin to prevent heart disease.  Diabetes screening involves taking a blood sample to check your fasting blood sugar level. This should be done once every 3 years after age 45 if you are at a normal weight and without risk  factors for diabetes. Testing should be considered at a younger age or be carried out more frequently if you are overweight and have at least 1 risk factor for diabetes.  Colorectal cancer can be detected and often prevented. Most routine colorectal cancer screening begins at the age of 50 and continues through age 75. However, your health care provider may recommend screening at an earlier age if you have risk factors for colon cancer. On a yearly basis, your health care provider may provide home test kits to check for hidden blood in the stool. A small camera at the end of a tube may be used to directly examine the colon (sigmoidoscopy or colonoscopy) to detect the earliest forms of colorectal cancer. Talk to your health care provider about this at age 50 when routine screening begins. A   direct exam of the colon should be repeated every 5-10 years through age 75, unless early forms of precancerous polyps or small growths are found.  People who are at an increased risk for hepatitis B should be screened for this virus. You are considered at high risk for hepatitis B if:  You were born in a country where hepatitis B occurs often. Talk with your health care provider about which countries are considered high risk.  Your parents were born in a high-risk country and you have not received a shot to protect against hepatitis B (hepatitis B vaccine).  You have HIV or AIDS.  You use needles to inject street drugs.  You live with, or have sex with, someone who has hepatitis B.  You are a man who has sex with other men (MSM).  You get hemodialysis treatment.  You take certain medicines for conditions like cancer, organ transplantation, and autoimmune conditions.  Hepatitis C blood testing is recommended for all people born from 1945 through 1965 and any individual with known risk factors for hepatitis C.  Healthy men should no longer receive prostate-specific antigen (PSA) blood tests as part of  routine cancer screening. Talk to your health care provider about prostate cancer screening.  Testicular cancer screening is not recommended for adolescents or adult males who have no symptoms. Screening includes self-exam, a health care provider exam, and other screening tests. Consult with your health care provider about any symptoms you have or any concerns you have about testicular cancer.  Practice safe sex. Use condoms and avoid high-risk sexual practices to reduce the spread of sexually transmitted infections (STIs).  You should be screened for STIs, including gonorrhea and chlamydia if:  You are sexually active and are younger than 24 years.  You are older than 24 years, and your health care provider tells you that you are at risk for this type of infection.  Your sexual activity has changed since you were last screened, and you are at an increased risk for chlamydia or gonorrhea. Ask your health care provider if you are at risk.  If you are at risk of being infected with HIV, it is recommended that you take a prescription medicine daily to prevent HIV infection. This is called pre-exposure prophylaxis (PrEP). You are considered at risk if:  You are a man who has sex with other men (MSM).  You are a heterosexual man who is sexually active with multiple partners.  You take drugs by injection.  You are sexually active with a partner who has HIV.  Talk with your health care provider about whether you are at high risk of being infected with HIV. If you choose to begin PrEP, you should first be tested for HIV. You should then be tested every 3 months for as long as you are taking PrEP.  Use sunscreen. Apply sunscreen liberally and repeatedly throughout the day. You should seek shade when your shadow is shorter than you. Protect yourself by wearing long sleeves, pants, a wide-brimmed hat, and sunglasses year round whenever you are outdoors.  Tell your health care provider of new moles  or changes in moles, especially if there is a change in shape or color. Also, tell your health care provider if a mole is larger than the size of a pencil eraser.  A one-time screening for abdominal aortic aneurysm (AAA) and surgical repair of large AAAs by ultrasound is recommended for men aged 65-75 years who are current or former smokers.  Stay   current with your vaccines (immunizations). Document Released: 02/15/2008 Document Revised: 08/24/2013 Document Reviewed: 01/14/2011 Exit[employer] Patient Information 2015 [employer]. This information is not intended to replace advice given to you by your health care provider. Make sure you discuss any questions you have with your health care provider.  

## 2014-04-08 NOTE — Progress Notes (Signed)
Subjective:    Patient ID: Derek DanielsBahekelwa S Keller, male    DOB: Apr 20, 1961, 53 y.o.   MRN: [id number]  HPI 53  -year-old patient who is in today for a wellness exam. He takes no chronic medications. Laboratory studies were reviewed. He has a history of occasional low back pain, which has been stable.  No concerns or complaints today.    Past Medical History  Diagnosis Date  . BACK PAIN 10/27/2008  . GYNECOMASTIA, UNILATERAL 07/07/2008  . HYPERLIPIDEMIA 10/23/2007  . OTALGIA 09/21/2007  . 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 Testosterone deficiency   . Head injury, closed     History   Social History  . Marital Status: Married    Spouse Name: N/A    Number of Children: N/A  . Years of Education: N/A   Occupational History  . Not on file.   Social History Main Topics  . Smoking status: Never Smoker   . Smokeless tobacco: Never Used  . Alcohol Use: No  . Drug Use: No  . Sexual Activity: Not on file   Other Topics Concern  . Not on file   Social History Narrative  . No narrative on file    Past Surgical History  Procedure Laterality Date  . No past surgeries      Family History  Problem Relation Age of Onset  . Colon cancer Neg Hx     No Known Allergies  No current outpatient prescriptions on file prior to visit.   No current facility-administered medications on file prior to visit.    BP 140/90  Pulse 59  Temp(Src) 98 F (36.7 C) (Oral)  Resp 18  Ht 5' 6.5" (1.689 m)  Wt 170 lb (77.111 kg)  BMI 27.03 kg/m2  SpO2 98%  Alcohol-Tobacco  Smoking Status: never  Allergies (verified):  No Known Drug Allergies   Past History:  Past Medical History:   Hyperlipidemia  remote history of malaria  history of closed head injury in 1990  gynecomastia  testosterone deficiency  History of positive PPD  Past Surgical History:   none   Family History:   details of his family history unclear,  live [place]. Father  history of  diabetes and malaria and died in 2012. 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  HENT: Negative for congestion, dental problem, ear pain, hearing loss, mouth sores, rhinorrhea, sinus pressure, sneezing, tinnitus, trouble swallowing and voice change.   Eyes: Negative for photophobia, pain, redness and visual disturbance.  Respiratory: Positive for shortness of breath. Negative for apnea, cough, choking, chest tightness and wheezing.   Cardiovascular: Positive for chest pain. Negative for palpitations and leg swelling.  Gastrointestinal: Negative for nausea, vomiting, abdominal pain, diarrhea, constipation, blood in stool, abdominal distention, anal bleeding and rectal pain.  Genitourinary: Negative for dysuria, urgency, frequency, hematuria, flank pain, decreased urine volume, discharge, penile swelling, scrotal swelling, difficulty urinating, genital sores and testicular pain.  Musculoskeletal: Negative for arthralgias, back pain, gait problem, joint swelling, myalgias, neck pain and neck stiffness.  Skin: Negative for color change, rash and wound.  Neurological: Negative for dizziness, tremors, seizures, syncope, facial asymmetry, speech  difficulty, weakness, light-headedness, numbness and headaches.  Hematological: Negative for adenopathy. Does not bruise/bleed easily.  Psychiatric/Behavioral: Negative for suicidal ideas, hallucinations, behavioral problems, confusion, sleep disturbance, self-injury, dysphoric mood, decreased concentration and agitation. The patient is not nervous/anxious.        Objective:   Physical Exam  Constitutional: He appears well-developed and  well-nourished.  HENT:  Head: Normocephalic and atraumatic.  Right Ear: External ear normal.  Left Ear: External ear normal.  Nose: Nose normal.  Mouth/Throat: Oropharynx is clear and moist.  Pharyngeal crowding  Eyes: Conjunctivae and EOM are normal. Pupils are equal, round, and reactive to light. No scleral icterus.  Neck: Normal range of motion. Neck supple. No JVD present. No thyromegaly present.  Cardiovascular: Regular rhythm, normal heart sounds and intact distal pulses.  Exam reveals no gallop and no friction rub.   No murmur heard. Pulmonary/Chest: Effort normal and breath sounds normal. He exhibits no tenderness.  Abdominal: Soft. Bowel sounds are normal. He exhibits no distension and no mass. There is no tenderness.  Genitourinary: Prostate normal and penis normal.  Musculoskeletal: Normal range of motion. He exhibits no edema and no tenderness.  Lymphadenopathy:    He has no cervical adenopathy.  Neurological: He is alert. He has normal reflexes. No cranial nerve deficit. Coordination normal.  Skin: Skin is warm and dry. No rash noted.  Psychiatric: He has a normal mood and affect. His behavior is normal.          Assessment & Plan:    Preventive health examination.  Unremarkable exam.  Patient had screening colonoscopy 2014 Laboratory studies reviewed Heart healthy diet recommended Regular exercise encouraged

## 2014-04-08 NOTE — Progress Notes (Signed)
Pre visit review using our clinic review tool, if applicable. No additional management support is needed unless otherwise documented below in the visit note. 

## 2015-04-06 ENCOUNTER — Other Ambulatory Visit (INDEPENDENT_AMBULATORY_CARE_PROVIDER_SITE_OTHER): Payer: 59

## 2015-04-06 DIAGNOSIS — Z0000 Encounter for general adult medical examination without abnormal findings: Secondary | ICD-10-CM | POA: Diagnosis not present

## 2015-04-06 LAB — LIPID PANEL
CHOL/HDL RATIO: 5
CHOLESTEROL: 212 mg/dL — AB (ref 0–200)
HDL: 43.5 mg/dL (ref >39.00)
LDL Cholesterol: 153 mg/dL — ABNORMAL HIGH (ref 0–99)
NonHDL: 168.77
Triglycerides: 80 mg/dL (ref 0.0–149.0)
VLDL: 16 mg/dL (ref 0.0–40.0)

## 2015-04-06 LAB — CBC WITH DIFFERENTIAL/PLATELET
Basophils Absolute: 0 10*3/uL (ref 0.0–0.1)
Basophils Relative: 0.5 % (ref 0.0–3.0)
EOS ABS: 0.1 10*3/uL (ref 0.0–0.7)
Eosinophils Relative: 2.7 % (ref 0.0–5.0)
HEMATOCRIT: 40.7 % (ref 39.0–52.0)
HEMOGLOBIN: 13.9 g/dL (ref 13.0–17.0)
Lymphocytes Relative: 34.9 % (ref 12.0–46.0)
Lymphs Abs: 1.8 10*3/uL (ref 0.7–4.0)
MCHC: 34 g/dL (ref 30.0–36.0)
MCV: 85.9 fl (ref 78.0–100.0)
Monocytes Absolute: 0.3 10*3/uL (ref 0.1–1.0)
Monocytes Relative: 6.6 % (ref 3.0–12.0)
Neutro Abs: 2.8 10*3/uL (ref 1.4–7.7)
Neutrophils Relative %: 55.3 % (ref 43.0–77.0)
Platelets: 161 10*3/uL (ref 150.0–400.0)
RBC: 4.74 Mil/uL (ref 4.22–5.81)
RDW: 13.4 % (ref 11.5–15.5)
WBC: 5 10*3/uL (ref 4.0–10.5)

## 2015-04-06 LAB — HEPATIC FUNCTION PANEL
ALT: 15 U/L (ref 0–53)
AST: 15 U/L (ref 0–37)
Albumin: 4.3 g/dL (ref 3.5–5.2)
Alkaline Phosphatase: 62 U/L (ref 39–117)
BILIRUBIN DIRECT: 0.2 mg/dL (ref 0.0–0.3)
BILIRUBIN TOTAL: 1 mg/dL (ref 0.2–1.2)
Total Protein: 7.1 g/dL (ref 6.0–8.3)

## 2015-04-06 LAB — BASIC METABOLIC PANEL
BUN: 11 mg/dL (ref 6–23)
CO2: 27 mEq/L (ref 19–32)
CREATININE: 1.32 mg/dL (ref 0.40–1.50)
Calcium: 9.4 mg/dL (ref 8.4–10.5)
Chloride: 106 mEq/L (ref 96–112)
GFR: 72.2 mL/min (ref >60.00)
GLUCOSE: 98 mg/dL (ref 70–99)
Potassium: 3.9 mEq/L (ref 3.5–5.1)
SODIUM: 141 meq/L (ref 135–145)

## 2015-04-06 LAB — POCT URINALYSIS DIPSTICK
Bilirubin, UA: NEGATIVE
Glucose, UA: NEGATIVE
Ketones, UA: NEGATIVE
Leukocytes, UA: NEGATIVE
NITRITE UA: NEGATIVE
Spec Grav, UA: 1.015
UROBILINOGEN UA: 0.2
pH, UA: 7.5

## 2015-04-06 LAB — PSA: PSA: 0.42 ng/mL (ref 0.10–4.00)

## 2015-04-06 LAB — TSH: TSH: 2.2 u[IU]/mL (ref 0.35–4.50)

## 2015-04-10 ENCOUNTER — Ambulatory Visit: Payer: 59 | Admitting: Internal Medicine

## 2015-04-18 ENCOUNTER — Encounter: Payer: Self-pay | Admitting: Internal Medicine

## 2015-04-18 ENCOUNTER — Ambulatory Visit (INDEPENDENT_AMBULATORY_CARE_PROVIDER_SITE_OTHER): Payer: 59 | Admitting: Internal Medicine

## 2015-04-18 VITALS — BP 130/80 | HR 67 | Temp 98.5°F | Resp 20 | Ht 66.0 in | Wt 171.0 lb

## 2015-04-18 DIAGNOSIS — Z0000 Encounter for general adult medical examination without abnormal findings: Secondary | ICD-10-CM | POA: Diagnosis not present

## 2015-04-18 DIAGNOSIS — E785 Hyperlipidemia, unspecified: Secondary | ICD-10-CM

## 2015-04-18 NOTE — Patient Instructions (Signed)

## 2015-04-18 NOTE — Progress Notes (Signed)
Subjective:    Patient ID: Derek Wong, male    DOB: [birth year], 54 y.o.   MRN: [id number]  HPI 58   -year-old patient who is in today for a wellness exam. He takes no chronic medications. Laboratory studies were reviewed. He has a history of occasional low back pain, which has been stable.  No concerns or complaints today except for some minor stiffness in the small joints of his hands and both knees.    Past Medical History  Diagnosis Date  . BACK PAIN 10/27/2008  . GYNECOMASTIA, UNILATERAL 07/07/2008  . HYPERLIPIDEMIA 10/23/2007  . OTALGIA 09/21/2007  . 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 Testosterone deficiency   . Head injury, closed     Social History   Social History  . Marital Status: Married    Spouse Name: N/A  . Number of Children: N/A  . Years of Education: N/A   Occupational History  . Not on file.   Social History Main Topics  . Smoking status: Never Smoker   . Smokeless tobacco: Never Used  . Alcohol Use: No  . Drug Use: No  . Sexual Activity: Not on file   Other Topics Concern  . Not on file   Social History Narrative    Past Surgical History  Procedure Laterality Date  . No past surgeries      Family History  Problem Relation Age of Onset  . Colon cancer Neg Hx     No Known Allergies  Current Outpatient Prescriptions on File Prior to Visit  Medication Sig Dispense Refill  . Acetaminophen (PAIN RELIEF PO) Take 2 tablets by mouth as needed.     No current facility-administered medications on file prior to visit.    BP 130/80 mmHg  Pulse 67  Temp(Src) 98.5 F (36.9 C) (Oral)  Resp 20  Ht 5\' 6"  (1.676 m)  Wt 171 lb (77.565 kg)  BMI 27.61 kg/m2  SpO2 98%  Alcohol-Tobacco  Smoking Status: never  Allergies (verified):  No Known Drug Allergies   Past History:  Past Medical History:   Hyperlipidemia  remote history of malaria  history of closed head injury in 1989/02/07  gynecomastia  testosterone  deficiency  History of positive PPD  Past Surgical History:   none   Family History:   details of his family history unclear,  live in [place]. Father  history of diabetes and malaria and died in 02-08-11. 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  HENT: Negative for congestion, dental problem, ear pain, hearing loss, mouth sores, rhinorrhea, sinus pressure, sneezing, tinnitus, trouble swallowing and voice change.   Eyes: Negative for photophobia, pain, redness and visual disturbance.  Respiratory: Positive for shortness of breath. Negative for apnea, cough, choking, chest tightness and wheezing.   Cardiovascular: Positive for chest pain. Negative for palpitations and leg swelling.  Gastrointestinal: Negative for nausea, vomiting, abdominal pain, diarrhea, constipation, blood in stool, abdominal distention, anal bleeding and rectal pain.  Genitourinary: Negative for dysuria, urgency, frequency, hematuria, flank pain, decreased urine volume, discharge, penile swelling, scrotal swelling, difficulty urinating, genital sores and testicular pain.  Musculoskeletal: Negative for myalgias, back  pain, joint swelling, arthralgias, gait problem, neck pain and neck stiffness.  Skin: Negative for color change, rash and wound.  Neurological: Negative for dizziness, tremors, seizures, syncope, facial asymmetry, speech difficulty, weakness, light-headedness, numbness and headaches.  Hematological: Negative for adenopathy. Does not bruise/bleed easily.  Psychiatric/Behavioral: Negative for suicidal ideas, hallucinations, behavioral problems, confusion, sleep disturbance, self-injury, dysphoric mood,  decreased concentration and agitation. The patient is not nervous/anxious.        Objective:   Physical Exam  Constitutional: He appears well-developed and well-nourished.  HENT:  Head: Normocephalic and atraumatic.  Right Ear: External ear normal.  Left Ear: External ear normal.  Nose: Nose normal.  Mouth/Throat: Oropharynx is clear and moist.  Pharyngeal crowding  Eyes: Conjunctivae and EOM are normal. Pupils are equal, round, and reactive to light. No scleral icterus.  Neck: Normal range of motion. Neck supple. No JVD present. No thyromegaly present.  Cardiovascular: Regular rhythm, normal heart sounds and intact distal pulses.  Exam reveals no gallop and no friction rub.   No murmur heard. Pulmonary/Chest: Effort normal and breath sounds normal. He exhibits no tenderness.  Bilateral gynecomastia  Abdominal: Soft. Bowel sounds are normal. He exhibits no distension and no mass. There is no tenderness.  Genitourinary: Prostate normal and penis normal. Guaiac negative stool.  Musculoskeletal: Normal range of motion. He exhibits no edema or tenderness.  No signs of active synovitis  Lymphadenopathy:    He has no cervical adenopathy.  Neurological: He is alert. He has normal reflexes. No cranial nerve deficit. Coordination normal.  Skin: Skin is warm and dry. No rash noted.  Psychiatric: He has a normal mood and affect. His behavior is normal.          Assessment & Plan:    Preventive health examination.  Unremarkable exam.  Patient had screening colonoscopy 2014 Laboratory studies reviewed Heart healthy diet recommended Regular exercise encouraged  Return in one year or as needed

## 2015-04-18 NOTE — Progress Notes (Signed)
Pre visit review using our clinic review tool, if applicable. No additional management support is needed unless otherwise documented below in the visit note. 

## 2015-06-09 NOTE — Progress Notes (Unsigned)
   Subjective:    Patient ID: Derek Wong, male    DOB: [birth year], 54 y.o.   MRN: [id number]  HPI    Review of Systems     Objective:   Physical Exam        Assessment & Plan:

## 2015-06-12 ENCOUNTER — Ambulatory Visit (INDEPENDENT_AMBULATORY_CARE_PROVIDER_SITE_OTHER): Payer: 59 | Admitting: Internal Medicine

## 2015-06-12 ENCOUNTER — Encounter: Payer: Self-pay | Admitting: Internal Medicine

## 2015-06-12 VITALS — BP 140/90 | HR 63 | Temp 98.2°F | Resp 18 | Ht 66.0 in | Wt 176.0 lb

## 2015-06-12 DIAGNOSIS — M542 Cervicalgia: Secondary | ICD-10-CM | POA: Diagnosis not present

## 2015-06-12 MED ORDER — MELOXICAM 15 MG PO TABS: 15.0000 mg | ORAL_TABLET | Freq: Every day | ORAL | Status: DC

## 2015-06-12 NOTE — Patient Instructions (Signed)
Cervical Sprain  A cervical sprain is an injury in the neck in which the strong, fibrous tissues (ligaments) that connect your neck bones stretch or tear. Cervical sprains can range from mild to severe. Severe cervical sprains can cause the neck vertebrae to be unstable. This can lead to damage of the spinal cord and can result in serious nervous system problems. The amount of time it takes for a cervical sprain to get better depends on the cause and extent of the injury. Most cervical sprains heal in 1 to 3 weeks.  CAUSES   Severe cervical sprains may be caused by:    Contact sport injuries (such as from football, rugby, wrestling, hockey, auto racing, gymnastics, diving, martial arts, or boxing).    Motor vehicle collisions.    Whiplash injuries. This is an injury from a sudden forward and backward whipping movement of the head and neck.   Falls.   Mild cervical sprains may be caused by:    Being in an awkward position, such as while cradling a telephone between your ear and shoulder.    Sitting in a chair that does not offer proper support.    Working at a poorly designed computer station.    Looking up or down for long periods of time.   SYMPTOMS    Pain, soreness, stiffness, or a burning sensation in the front, back, or sides of the neck. This discomfort may develop immediately after the injury or slowly, 24 hours or more after the injury.    Pain or tenderness directly in the middle of the back of the neck.    Shoulder or upper back pain.    Limited ability to move the neck.    Headache.    Dizziness.    Weakness, numbness, or tingling in the hands or arms.    Muscle spasms.    Difficulty swallowing or chewing.    Tenderness and swelling of the neck.   DIAGNOSIS   Most of the time your health care provider can diagnose a cervical sprain by taking your history and doing a physical exam. Your health care provider will ask about previous neck injuries and any known neck  problems, such as arthritis in the neck. X-rays may be taken to find out if there are any other problems, such as with the bones of the neck. Other tests, such as a CT scan or MRI, may also be needed.   TREATMENT   Treatment depends on the severity of the cervical sprain. Mild sprains can be treated with rest, keeping the neck in place (immobilization), and pain medicines. Severe cervical sprains are immediately immobilized. Further treatment is done to help with pain, muscle spasms, and other symptoms and may include:   Medicines, such as pain relievers, numbing medicines, or muscle relaxants.    Physical therapy. This may involve stretching exercises, strengthening exercises, and posture training. Exercises and improved posture can help stabilize the neck, strengthen muscles, and help stop symptoms from returning.   HOME CARE INSTRUCTIONS    Put ice on the injured area.     Put ice in a plastic bag.     Place a towel between your skin and the bag.     Leave the ice on for 15-20 minutes, 3-4 times a day.    If your injury was severe, you may have been given a cervical collar to wear. A cervical collar is a two-piece collar designed to keep your neck from moving while it heals.      Do not remove the collar unless instructed by your health care provider.    If you have long hair, keep it outside of the collar.    Ask your health care provider before making any adjustments to your collar. Minor adjustments may be required over time to improve comfort and reduce pressure on your chin or on the back of your head.    Ifyou are allowed to remove the collar for cleaning or bathing, follow your health care provider's instructions on how to do so safely.    Keep your collar clean by wiping it with mild soap and water and drying it completely. If the collar you have been given includes removable pads, remove them every 1-2 days and hand wash them with soap and water. Allow them to air dry. They should be completely  dry before you wear them in the collar.    If you are allowed to remove the collar for cleaning and bathing, wash and dry the skin of your neck. Check your skin for irritation or sores. If you see any, tell your health care provider.    Do not drive while wearing the collar.    Only take over-the-counter or prescription medicines for pain, discomfort, or fever as directed by your health care provider.    Keep all follow-up appointments as directed by your health care provider.    Keep all physical therapy appointments as directed by your health care provider.    Make any needed adjustments to your workstation to promote good posture.    Avoid positions and activities that make your symptoms worse.    Warm up and stretch before being active to help prevent problems.   SEEK MEDICAL CARE IF:    Your pain is not controlled with medicine.    You are unable to decrease your pain medicine over time as planned.    Your activity level is not improving as expected.   SEEK IMMEDIATE MEDICAL CARE IF:    You develop any bleeding.   You develop stomach upset.   You have signs of an allergic reaction to your medicine.    Your symptoms get worse.    You develop new, unexplained symptoms.    You have numbness, tingling, weakness, or paralysis in any part of your body.   MAKE SURE YOU:    Understand these instructions.   Will watch your condition.   Will get help right away if you are not doing well or get worse.     This information is not intended to replace advice given to you by your health care provider. Make sure you discuss any questions you have with your health care provider.     Document Released: 06/16/2007 Document Revised: 08/24/2013 Document Reviewed: 02/24/2013  Elsevier Interactive Patient Education 2016 [employer]

## 2015-06-12 NOTE — Progress Notes (Signed)
Pre visit review using our clinic review tool, if applicable. No additional management support is needed unless otherwise documented below in the visit note. 

## 2015-06-12 NOTE — Progress Notes (Signed)
   Subjective:    Patient ID: Derek Wong, male    DOB: October 03, 1960, 54 y.o.   MRN: [id number]  HPI  54 year old patient who presents with a 4 week history of right lateral neck pain.  No history of trauma.  He was evaluated on number of years ago for left shoulder pain.  Pain is aggravated by head turning to the right.  He has taken ibuprofen occasionally with some benefit.  Past Medical History  Diagnosis Date  . BACK PAIN 10/27/2008  . GYNECOMASTIA, UNILATERAL 07/07/2008  . HYPERLIPIDEMIA 10/23/2007  . OTALGIA 09/21/2007  . 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 Testosterone deficiency   . Head injury, closed     Social History   Social History  . Marital Status: Married    Spouse Name: N/A  . Number of Children: N/A  . Years of Education: N/A   Occupational History  . Not on file.   Social History Main Topics  . Smoking status: Never Smoker   . Smokeless tobacco: Never Used  . Alcohol Use: No  . Drug Use: No  . Sexual Activity: Not on file   Other Topics Concern  . Not on file   Social History Narrative    Past Surgical History  Procedure Laterality Date  . No past surgeries      Family History  Problem Relation Age of Onset  . Colon cancer Neg Hx     No Known Allergies  Current Outpatient Prescriptions on File Prior to Visit  Medication Sig Dispense Refill  . Acetaminophen (PAIN RELIEF PO) Take 2 tablets by mouth as needed.     No current facility-administered medications on file prior to visit.    BP 140/90 mmHg  Pulse 63  Temp(Src) 98.2 F (36.8 C) (Oral)  Resp 18  Ht  (1.676 m)  Wt 176 lb (79.833 kg)  BMI 28.42 kg/m2  SpO2 96%    Review of Systems  Musculoskeletal: Positive for neck pain and neck stiffness.       Objective:   Physical Exam  Constitutional: He appears well-developed and well-nourished. No distress.  Musculoskeletal:  Full range of motion right shoulder Tenderness over the right  trapezius musculature. Pain aggravated by head turning to the right          Assessment & Plan:   Right lateral neck pain.  Will place on meloxicam 15 mg daily when necessary.  Heat, massage therapy, as well as gentle range of motion.  Encouraged.  Will call if unimproved

## 2015-06-28 ENCOUNTER — Emergency Department (HOSPITAL_COMMUNITY)
Admission: EM | Admit: 2015-06-28 | Discharge: 2015-06-28 | Disposition: A | Discharge status: Home / Self Care | Payer: 59 | Attending: Emergency Medicine | Admitting: Emergency Medicine

## 2015-06-28 ENCOUNTER — Other Ambulatory Visit: Payer: Self-pay

## 2015-06-28 ENCOUNTER — Emergency Department (HOSPITAL_COMMUNITY): Payer: 59

## 2015-06-28 ENCOUNTER — Encounter (HOSPITAL_COMMUNITY): Payer: Self-pay | Admitting: Emergency Medicine

## 2015-06-28 DIAGNOSIS — Y92410 Unspecified street and highway as the place of occurrence of the external cause: Secondary | ICD-10-CM | POA: Insufficient documentation

## 2015-06-28 DIAGNOSIS — Y9389 Activity, other specified: Secondary | ICD-10-CM | POA: Insufficient documentation

## 2015-06-28 DIAGNOSIS — Z8669 Personal history of other diseases of the nervous system and sense organs: Secondary | ICD-10-CM | POA: Insufficient documentation

## 2015-06-28 DIAGNOSIS — F419 Anxiety disorder, unspecified: Secondary | ICD-10-CM | POA: Diagnosis not present

## 2015-06-28 DIAGNOSIS — Y998 Other external cause status: Secondary | ICD-10-CM | POA: Diagnosis not present

## 2015-06-28 DIAGNOSIS — Z8639 Personal history of other endocrine, nutritional and metabolic disease: Secondary | ICD-10-CM | POA: Insufficient documentation

## 2015-06-28 DIAGNOSIS — S299XXA Unspecified injury of thorax, initial encounter: Secondary | ICD-10-CM | POA: Insufficient documentation

## 2015-06-28 DIAGNOSIS — R079 Chest pain, unspecified: Secondary | ICD-10-CM

## 2015-06-28 LAB — COMPREHENSIVE METABOLIC PANEL
ALBUMIN: 4.2 g/dL (ref 3.5–5.0)
ALT: 19 U/L (ref 17–63)
ANION GAP: 10 (ref 5–15)
AST: 22 U/L (ref 15–41)
Alkaline Phosphatase: 62 U/L (ref 38–126)
BUN: 9 mg/dL (ref 6–20)
CALCIUM: 10.1 mg/dL (ref 8.9–10.3)
CHLORIDE: 105 mmol/L (ref 101–111)
CO2: 24 mmol/L (ref 22–32)
Creatinine, Ser: 1.1 mg/dL (ref 0.61–1.24)
GFR calc non Af Amer: >60 mL/min (ref >60)
GLUCOSE: 100 mg/dL — AB (ref 65–99)
POTASSIUM: 4.4 mmol/L (ref 3.5–5.1)
SODIUM: 139 mmol/L (ref 135–145)
Total Bilirubin: 0.7 mg/dL (ref 0.3–1.2)
Total Protein: 7.1 g/dL (ref 6.5–8.1)

## 2015-06-28 LAB — CBC WITH DIFFERENTIAL/PLATELET
BASOS PCT: 1 %
Basophils Absolute: 0 10*3/uL (ref 0.0–0.1)
EOS ABS: 0.1 10*3/uL (ref 0.0–0.7)
Eosinophils Relative: 1 %
HCT: 41.8 % (ref 39.0–52.0)
HEMOGLOBIN: 14.3 g/dL (ref 13.0–17.0)
Lymphocytes Relative: 34 %
Lymphs Abs: 2 10*3/uL (ref 0.7–4.0)
MCH: 29 pg (ref 26.0–34.0)
MCHC: 34.2 g/dL (ref 30.0–36.0)
MCV: 84.8 fL (ref 78.0–100.0)
MONO ABS: 0.3 10*3/uL (ref 0.1–1.0)
MONOS PCT: 6 %
Neutro Abs: 3.3 10*3/uL (ref 1.7–7.7)
Neutrophils Relative %: 58 %
PLATELETS: 155 10*3/uL (ref 150–400)
RBC: 4.93 MIL/uL (ref 4.22–5.81)
RDW: 13.4 % (ref 11.5–15.5)
WBC: 5.7 10*3/uL (ref 4.0–10.5)

## 2015-06-28 LAB — I-STAT TROPONIN, ED: Troponin i, poc: 0 ng/mL (ref 0.00–0.08)

## 2015-06-28 MED ORDER — LORAZEPAM 1 MG PO TABS
1.0000 mg | ORAL_TABLET | Freq: Once | ORAL | Status: AC
Start: 2015-06-28 — End: 2015-06-28
  Administered 2015-06-28: 1 mg via ORAL
  Filled 2015-06-28: qty 1
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
## 2015-06-28 NOTE — ED Notes (Addendum)
Patient transported to X-ray without distress.  

## 2015-06-28 NOTE — ED Notes (Signed)
PT ambulated with baseline gait; VSS; A&Ox3; no signs of distress; respirations even and unlabored; skin warm and dry; no questions upon discharge.  

## 2015-06-28 NOTE — ED Notes (Signed)
Pt reports pain improved in neck, head, and chest after medicine.

## 2015-06-28 NOTE — Discharge Instructions (Signed)
Tylenol for pain and follow up with your md next week if problems
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
## 2015-07-06 ENCOUNTER — Telehealth: Payer: Self-pay | Admitting: Internal Medicine

## 2015-07-06 NOTE — Telephone Encounter (Signed)
Pt scheduled tomorrow at 4:15 PM °
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
## 2015-07-07 ENCOUNTER — Ambulatory Visit (INDEPENDENT_AMBULATORY_CARE_PROVIDER_SITE_OTHER): Payer: 59 | Admitting: Internal Medicine

## 2015-07-07 ENCOUNTER — Encounter: Payer: Self-pay | Admitting: Internal Medicine

## 2015-07-07 VITALS — BP 130/90 | HR 67 | Temp 98.4°F | Resp 20 | Ht 66.0 in | Wt 175.0 lb

## 2015-07-07 DIAGNOSIS — M542 Cervicalgia: Secondary | ICD-10-CM

## 2015-07-07 MED ORDER — MELOXICAM 15 MG PO TABS
15.0000 mg | ORAL_TABLET | Freq: Every day | ORAL | Status: DC | PRN
Start: 2015-07-07 — End: 2016-10-17

## 2015-07-07 NOTE — Patient Instructions (Signed)
You  may move around, but avoid painful motions and activities.  Apply heat to the sore area for 15 to 20 minutes 3 or 4 times daily for the next two to 3 days. 
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
## 2015-07-07 NOTE — Progress Notes (Signed)
Pre visit review using our clinic review tool, if applicable. No additional management support is needed unless otherwise documented below in the visit note. 

## 2015-07-08 ENCOUNTER — Other Ambulatory Visit: Payer: Self-pay | Admitting: Internal Medicine

## 2015-07-10 NOTE — Telephone Encounter (Signed)
Rx request refused.

## 2015-10-02 ENCOUNTER — Encounter: Payer: Self-pay | Admitting: Internal Medicine

## 2015-10-02 ENCOUNTER — Ambulatory Visit (INDEPENDENT_AMBULATORY_CARE_PROVIDER_SITE_OTHER): Payer: 59 | Admitting: Internal Medicine

## 2015-10-02 VITALS — BP 140/90 | HR 72 | Temp 98.7°F | Resp 20 | Ht 66.0 in | Wt 177.0 lb

## 2015-10-02 DIAGNOSIS — E785 Hyperlipidemia, unspecified: Secondary | ICD-10-CM

## 2015-10-02 DIAGNOSIS — N62 Hypertrophy of breast: Secondary | ICD-10-CM

## 2015-10-02 DIAGNOSIS — E291 Testicular hypofunction: Secondary | ICD-10-CM

## 2015-10-02 HISTORY — DX: Testicular hypofunction: E29.1

## 2015-10-02 NOTE — Progress Notes (Signed)
Pre visit review using our clinic review tool, if applicable. No additional management support is needed unless otherwise documented below in the visit note. 

## 2015-10-02 NOTE — Progress Notes (Signed)
Subjective:    Patient ID: Derek Wong, male    DOB: Jan 04, 1961, 55 y.o.   MRN: [id number]  HPI 55 year old patient who presents with a three-week history of increasing pain and swelling involving his left breast. In November 2009, he presented with a 2 to three-month history of increasing pain and swelling involving his right breast.  At that time he was felt to have mild right-sided gynecomastia and a prominent left breast.  Evaluation included a normal estradiol level;  Normal  HCG ; normal TSH and LH with slightly low testosterone level of 3 3 9.  The patient takes no medications except occasional meloxicam.  Denies any herbal products or antiulcer medications  Past Medical History  Diagnosis Date  . BACK PAIN 10/27/2008  . GYNECOMASTIA, UNILATERAL 07/07/2008  . HYPERLIPIDEMIA 10/23/2007  . OTALGIA 09/21/2007  . 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 Testosterone deficiency   . Head injury, closed     Social History   Social History  . Marital Status: Married    Spouse Name: N/A  . Number of Children: N/A  . Years of Education: N/A   Occupational History  . Not on file.   Social History Main Topics  . Smoking status: Never Smoker   . Smokeless tobacco: Never Used  . Alcohol Use: No  . Drug Use: No  . Sexual Activity: Not on file   Other Topics Concern  . Not on file   Social History Narrative    Past Surgical History  Procedure Laterality Date  . No past surgeries      Family History  Problem Relation Age of Onset  . Colon cancer Neg Hx     No Known Allergies  Current Outpatient Prescriptions on File Prior to Visit  Medication Sig Dispense Refill  . meloxicam (MOBIC) 15 MG tablet Take 1 tablet (15 mg total) by mouth daily as needed for pain. 30 tablet 0   No current facility-administered medications on file prior to visit.    BP 140/90 mmHg  Pulse 72  Temp(Src) 98.7 F (37.1 C) (Oral)  Resp 20  Ht  (1.676 m)  Wt 177  lb (80.287 kg)  BMI 28.58 kg/m2  SpO2 98%      Review of Systems  Constitutional: Negative for fever, chills, appetite change and fatigue.  HENT: Negative for congestion, dental problem, ear pain, hearing loss, sore throat, tinnitus, trouble swallowing and voice change.   Eyes: Negative for pain, discharge and visual disturbance.  Respiratory: Negative for cough, chest tightness, wheezing and stridor.   Cardiovascular: Negative for chest pain, palpitations and leg swelling.  Gastrointestinal: Negative for nausea, vomiting, abdominal pain, diarrhea, constipation, blood in stool and abdominal distention.  Genitourinary: Negative for urgency, hematuria, flank pain, discharge, difficulty urinating and genital sores.  Musculoskeletal: Negative for myalgias, back pain, joint swelling, arthralgias, gait problem and neck stiffness.  Skin: Negative for rash.  Neurological: Negative for dizziness, syncope, speech difficulty, weakness, numbness and headaches.  Hematological: Negative for adenopathy. Does not bruise/bleed easily.  Psychiatric/Behavioral: Negative for behavioral problems and dysphoric mood. The patient is not nervous/anxious.        Objective:   Physical Exam  Constitutional: He appears well-developed and well-nourished. No distress.  Pulmonary/Chest: Effort normal and breath sounds normal. No respiratory distress.  Both breasts were symmetrically prominent; Mild left-sided tenderness without mass effect          Assessment & Plan:   Left breast tenderness.  Clinical exam consistent with mild bilateral gynecomastia versus normal variant.  Patient does have a history of a borderline low testosterone level, normal estradiol, normal hCG and normal LH.  Will repeat a testosterone level and LH

## 2015-10-02 NOTE — Patient Instructions (Signed)
Call or return to clinic prn if these symptoms worsen or fail to improve as anticipated.   Take Aleve 200 mg twice daily for pain or swelling 

## 2015-10-03 LAB — TESTOSTERONE: TESTOSTERONE: 230.2 ng/dL — AB (ref 300.00–890.00)

## 2015-10-03 LAB — LUTEINIZING HORMONE: LH: 3.89 m[IU]/mL (ref 1.50–9.30)

## 2015-10-05 ENCOUNTER — Other Ambulatory Visit: Payer: Self-pay | Admitting: *Deleted

## 2015-10-05 MED ORDER — TESTOSTERONE 20.25 MG/ACT (1.62%) TD GEL: TRANSDERMAL | Status: DC

## 2015-10-16 ENCOUNTER — Telehealth: Payer: Self-pay | Admitting: Internal Medicine

## 2015-10-16 DIAGNOSIS — E291 Testicular hypofunction: Secondary | ICD-10-CM

## 2015-10-16 NOTE — Telephone Encounter (Signed)
Patient has male hypogonadism- this is the medical indication why this medication has been prescribed

## 2015-10-16 NOTE — Telephone Encounter (Signed)
Please see message and advise 

## 2015-10-16 NOTE — Telephone Encounter (Signed)
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

## 2015-10-17 NOTE — Telephone Encounter (Signed)
Tim Lair, please see Dr.K's message.

## 2015-10-18 NOTE — Telephone Encounter (Signed)
I will resubmit prior authorization with dx of hypogonadism, please update this dx on patients problem list.

## 2015-10-18 NOTE — Telephone Encounter (Signed)
Chart is updated and Hypogonadism was added to Problem List.

## 2015-10-19 DIAGNOSIS — H524 Presbyopia: Secondary | ICD-10-CM | POA: Diagnosis not present

## 2015-10-19 DIAGNOSIS — H52223 Regular astigmatism, bilateral: Secondary | ICD-10-CM | POA: Diagnosis not present

## 2015-10-19 DIAGNOSIS — H5213 Myopia, bilateral: Secondary | ICD-10-CM | POA: Diagnosis not present

## 2015-11-29 NOTE — Telephone Encounter (Signed)
Medication was approved until 05-31-16. A fax will be sent to the office.

## 2016-01-09 ENCOUNTER — Ambulatory Visit: Payer: 59 | Admitting: Internal Medicine

## 2016-01-10 ENCOUNTER — Ambulatory Visit (INDEPENDENT_AMBULATORY_CARE_PROVIDER_SITE_OTHER): Payer: 59 | Admitting: Internal Medicine

## 2016-01-10 ENCOUNTER — Encounter: Payer: Self-pay | Admitting: Internal Medicine

## 2016-01-10 VITALS — BP 138/90 | HR 80 | Temp 98.5°F | Resp 20 | Ht 66.0 in | Wt 175.0 lb

## 2016-01-10 DIAGNOSIS — N62 Hypertrophy of breast: Secondary | ICD-10-CM

## 2016-01-10 DIAGNOSIS — E785 Hyperlipidemia, unspecified: Secondary | ICD-10-CM | POA: Diagnosis not present

## 2016-01-10 DIAGNOSIS — E291 Testicular hypofunction: Secondary | ICD-10-CM

## 2016-01-10 HISTORY — DX: Hypertrophy of breast: N62

## 2016-01-10 NOTE — Progress Notes (Signed)
Pre visit review using our clinic review tool, if applicable. No additional management support is needed unless otherwise documented below in the visit note. 

## 2016-01-10 NOTE — Progress Notes (Signed)
Subjective:    Patient ID: Derek Wong, male    DOB: [birth year], 55 y.o.   MRN: [id number]  HPI  55 year old patient who is seen today for follow-up of testosterone deficiency.  10/02/15. 55 year old patient who presents with a three-week history of increasing pain and swelling involving his left breast. In November 2009, he presented with a 2 to three-month history of increasing pain and swelling involving his right breast. At that time he was felt to have mild right-sided gynecomastia and a prominent left breast. Evaluation included a normal estradiol level; Normal HCG ; normal TSH and LH with slightly low testosterone level of 3 3 9.  Repeat testosterone was down to 2:30.  He apparently only took this for a few weeks and now has been off this medication.  He has no libido or ED issues.  Today he states that he has had left breast enlargement for about one year, although clearly this has been a problem for a number of years.  Clinical exam reveals bilateral symmetrical gynecomastia.  LH normal.  Patient is quite concerned about the cause of his gynecomastia.  Endocrine referral offered  Past Medical History  Diagnosis Date  . BACK PAIN 10/27/2008  . GYNECOMASTIA, UNILATERAL 07/07/2008  . HYPERLIPIDEMIA 10/23/2007  . OTALGIA 09/21/2007  . 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 Testosterone deficiency   . Head injury, closed      Social History   Social History  . Marital Status: Married    Spouse Name: N/A  . Number of Children: N/A  . Years of Education: N/A   Occupational History  . Not on file.   Social History Main Topics  . Smoking status: Never Smoker   . Smokeless tobacco: Never Used  . Alcohol Use: No  . Drug Use: No  . Sexual Activity: Not on file   Other Topics Concern  . Not on file   Social History Narrative    Past Surgical History  Procedure Laterality Date  . No past surgeries      Family History  Problem Relation Age of  Onset  . Colon cancer Neg Hx     No Known Allergies  Current Outpatient Prescriptions on File Prior to Visit  Medication Sig Dispense Refill  . meloxicam (MOBIC) 15 MG tablet Take 1 tablet (15 mg total) by mouth daily as needed for pain. 30 tablet 0  . Testosterone (ANDROGEL PUMP) 20.25 MG/ACT (1.62%) GEL APPLY TWO PUMPS TO UPPER ARM AND SHOULDER AREA EVERY MORNING. (Patient not taking: Reported on 01/10/2016) 75 g 2   No current facility-administered medications on file prior to visit.    BP 138/90 mmHg  Pulse 80  Temp(Src) 98.5 F (36.9 C) (Oral)  Resp 20  Ht  (1.676 m)  Wt 175 lb (79.379 kg)  BMI 28.26 kg/m2  SpO2 96%       Review of Systems  Constitutional: Negative for fever, chills, appetite change and fatigue.  HENT: Negative for congestion, dental problem, ear pain, hearing loss, sore throat, tinnitus, trouble swallowing and voice change.   Eyes: Negative for pain, discharge and visual disturbance.  Respiratory: Negative for cough, chest tightness, wheezing and stridor.   Cardiovascular: Negative for chest pain, palpitations and leg swelling.  Gastrointestinal: Negative for nausea, vomiting, abdominal pain, diarrhea, constipation, blood in stool and abdominal distention.  Genitourinary: Negative for urgency, hematuria, flank pain, discharge, difficulty urinating and genital sores.  Musculoskeletal: Negative for myalgias, back pain, joint swelling,  arthralgias, gait problem and neck stiffness.  Skin: Negative for rash.  Neurological: Negative for dizziness, syncope, speech difficulty, weakness, numbness and headaches.  Hematological: Negative for adenopathy. Does not bruise/bleed easily.  Psychiatric/Behavioral: Negative for behavioral problems and dysphoric mood. The patient is not nervous/anxious.        Objective:   Physical Exam  Constitutional: He is oriented to person, place, and time. He appears well-developed.  HENT:  Head: Normocephalic.  Right  Ear: External ear normal.  Left Ear: External ear normal.  Eyes: Conjunctivae and EOM are normal.  Neck: Normal range of motion.  Cardiovascular: Normal rate and normal heart sounds.   Pulmonary/Chest: Breath sounds normal.  Bilateral mild gynecomastia  Abdominal: Bowel sounds are normal.  Musculoskeletal: Normal range of motion. He exhibits no edema or tenderness.  Neurological: He is alert and oriented to person, place, and time.  Psychiatric: He has a normal mood and affect. His behavior is normal.          Assessment & Plan:   Bilateral gynecomastia Secondary testosterone deficiency (normal.  LH)  We'll set up for endocrine referral

## 2016-01-10 NOTE — Patient Instructions (Signed)
Endocrinology consultation as discussed

## 2016-01-18 ENCOUNTER — Telehealth: Payer: Self-pay | Admitting: General Practice

## 2016-01-19 MED ORDER — TESTOSTERONE 20.25 MG/ACT (1.62%) TD GEL: TRANSDERMAL | Status: DC

## 2016-01-19 NOTE — Telephone Encounter (Signed)
Okay 

## 2016-01-19 NOTE — Telephone Encounter (Signed)
Rx faxed to pharmacy  

## 2016-01-23 ENCOUNTER — Telehealth: Payer: Self-pay | Admitting: Internal Medicine

## 2016-01-23 NOTE — Telephone Encounter (Signed)
Error/ltd ° °

## 2016-02-06 ENCOUNTER — Encounter: Payer: Self-pay | Admitting: Endocrinology

## 2016-02-06 ENCOUNTER — Ambulatory Visit (INDEPENDENT_AMBULATORY_CARE_PROVIDER_SITE_OTHER): Payer: 59 | Admitting: Endocrinology

## 2016-02-06 ENCOUNTER — Other Ambulatory Visit: Payer: Self-pay | Admitting: Endocrinology

## 2016-02-06 VITALS — BP 136/86 | HR 71 | Temp 98.6°F | Ht 66.0 in | Wt 176.0 lb

## 2016-02-06 DIAGNOSIS — N62 Hypertrophy of breast: Secondary | ICD-10-CM

## 2016-02-06 DIAGNOSIS — E291 Testicular hypofunction: Secondary | ICD-10-CM | POA: Diagnosis not present

## 2016-02-06 NOTE — Patient Instructions (Addendum)
Testosterone treatment has risks, including increased or decreased fertility (depending on the type of treatment), hair loss, prostate cancer, benign prostate enlargement, blood clots, liver problems, lower hdl ("good cholesterol"), polycythemia (opposite of anemia), sleep apnea, and behavior changes.  blood tests are requested for you today.  We'll let you know about the results.  I have also requested a mammogram for you.  you will receive a phone call, about a day and time for an appointment.  Based on there results, I hope to be be able to prescribe for you a pill for this. Please come back for a follow-up appointment in 3 months.

## 2016-02-06 NOTE — Progress Notes (Signed)
Subjective:    Patient ID: Derek Wong, male    DOB: July 08, 1961, 55 y.o.   MRN: [id number]  HPI Pt reports he had puberty at the normal age.  He has no biological children, but the reason for that is unclear.  He says he has never taken illicit androgens.  He has been on any prescribed medication for hypogonadism since early 2017.  He does not take antiandrogens or opioids.  He denies any h/o XRT, or genital infection.  He has never had surgery, or a serious injury to the head or genital area.  He does not consume alcohol.  He has 1 year of slight swelling at the left breast, but no assoc pain. Since on the androgel, he felt no better.  He last took approx 1 month ago.   Past Medical History  Diagnosis Date  . BACK PAIN 10/27/2008  . GYNECOMASTIA, UNILATERAL 07/07/2008  . HYPERLIPIDEMIA 10/23/2007  . OTALGIA 09/21/2007  . 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 Testosterone deficiency   . Head injury, closed     Past Surgical History  Procedure Laterality Date  . No past surgeries      Social History   Social History  . Marital Status: Married    Spouse Name: N/A  . Number of Children: N/A  . Years of Education: N/A   Occupational History  . Not on file.   Social History Main Topics  . Smoking status: Never Smoker   . Smokeless tobacco: Never Used  . Alcohol Use: No  . Drug Use: No  . Sexual Activity: Not on file   Other Topics Concern  . Not on file   Social History Narrative    Current Outpatient Prescriptions on File Prior to Visit  Medication Sig Dispense Refill  . meloxicam (MOBIC) 15 MG tablet Take 1 tablet (15 mg total) by mouth daily as needed for pain. 30 tablet 0   No current facility-administered medications on file prior to visit.    No Known Allergies  Family History  Problem Relation Age of Onset  . Colon cancer Neg Hx   . Other Neg Hx     hypogonadism    BP 136/86 mmHg  Pulse 71  Temp(Src) 98.6 F (37 C) (Oral)  Ht   (1.676 m)  Wt 176 lb (79.833 kg)  BMI 28.42 kg/m2  SpO2 97%   Review of Systems denies depression, numbness, weight change, decreased urinary stream, muscle weakness, fever, headache, easy bruising, sob, rash, blurry vision, rhinorrhea, chest pain. He has mild ED sxs.      Objective:   Physical Exam VS: see vs page GEN: no distress HEAD: head: no deformity eyes: no periorbital swelling, no proptosis external nose and ears are normal mouth: no lesion seen NECK: supple, thyroid is not enlarged CHEST WALL: no deformity LUNGS: clear to auscultation BREASTS:  bilat gynecomastia/pseudogynecomastia. CV: reg rate and rhythm, no murmur ABD: abdomen is soft, nontender.  no hepatosplenomegaly.  not distended.  no hernia GENITALIA:  Normal male.   MUSCULOSKELETAL: muscle bulk and strength are grossly normal.  no obvious joint swelling.  gait is normal and steady EXTEMITIES: no deformity.  no ulcer on the feet.  feet are of normal color and temp.  no edema PULSES: dorsalis pedis intact bilat.  no carotid bruit NEURO:  cn 2-12 grossly intact.   readily moves all 4's.  sensation is intact to touch on the feet SKIN:  Normal texture and temperature.  No rash or suspicious lesion is visible.  Normal hair distribution NODES:  None palpable at the neck PSYCH: alert, well-oriented.  Does not appear anxious nor depressed.    Lab Results  Component Value Date   TESTOSTERONE 230.20* 10/02/2015  LH=3 Lab Results  Component Value Date   TSH 2.20 04/06/2015   I have reviewed outside records, and summarized: Pt was noted to have gynecomastia, R>L.  He was noted to have low testosterone, and was rx'ed androgel.  He did not have ED sxs.     Assessment & Plan:  Gynecomastia/pseudogynecomastia, new to me, uncertain etiology Hypogonadism, mild.  I favor primary rx of the gynecomastia, at least for now.    Patient is advised the following: Patient Instructions  Testosterone treatment has  risks, including increased or decreased fertility (depending on the type of treatment), hair loss, prostate cancer, benign prostate enlargement, blood clots, liver problems, lower hdl ("good cholesterol"), polycythemia (opposite of anemia), sleep apnea, and behavior changes.  blood tests are requested for you today.  We'll let you know about the results.  I have also requested a mammogram for you.  you will receive a phone call, about a day and time for an appointment.  Based on there results, I hope to be be able to prescribe for you a pill for this. Please come back for a follow-up appointment in 3 months.     Romero BellingELLISON, Lanita Stammen, MD

## 2016-02-07 LAB — PROLACTIN: Prolactin: 6.4 ng/mL (ref 2.0–18.0)

## 2016-02-07 LAB — TESTOSTERONE,FREE AND TOTAL
TESTOSTERONE FREE: 5.6 pg/mL — AB (ref 7.2–24.0)
TESTOSTERONE: 431 ng/dL (ref 348–1197)

## 2016-02-14 LAB — ESTRADIOL, FREE
ESTRADIOL FREE: 0.57 pg/mL — AB (ref <=0.45)
Estradiol: 25 pg/mL (ref <=29)

## 2016-02-15 ENCOUNTER — Other Ambulatory Visit: Payer: Self-pay | Admitting: Endocrinology

## 2016-02-15 MED ORDER — TAMOXIFEN CITRATE 10 MG PO TABS: 10.0000 mg | ORAL_TABLET | Freq: Every day | ORAL | Status: DC

## 2016-02-26 ENCOUNTER — Ambulatory Visit
Admission: RE | Admit: 2016-02-26 | Discharge: 2016-02-26 | Disposition: A | Discharge status: Home / Self Care | Payer: 59 | Source: Ambulatory Visit | Attending: Endocrinology | Admitting: Endocrinology

## 2016-02-26 ENCOUNTER — Other Ambulatory Visit: Payer: Self-pay | Admitting: Endocrinology

## 2016-02-26 DIAGNOSIS — N62 Hypertrophy of breast: Secondary | ICD-10-CM

## 2016-02-26 DIAGNOSIS — E291 Testicular hypofunction: Secondary | ICD-10-CM

## 2016-02-26 DIAGNOSIS — N6489 Other specified disorders of breast: Secondary | ICD-10-CM | POA: Diagnosis not present

## 2016-04-03 ENCOUNTER — Other Ambulatory Visit: Payer: Self-pay

## 2016-04-03 MED ORDER — TAMOXIFEN CITRATE 10 MG PO TABS: 10.0000 mg | ORAL_TABLET | Freq: Every day | ORAL | 1 refills | Status: DC

## 2016-04-19 ENCOUNTER — Other Ambulatory Visit (INDEPENDENT_AMBULATORY_CARE_PROVIDER_SITE_OTHER): Payer: 59

## 2016-04-19 DIAGNOSIS — Z0000 Encounter for general adult medical examination without abnormal findings: Secondary | ICD-10-CM | POA: Diagnosis not present

## 2016-04-19 LAB — CBC WITH DIFFERENTIAL/PLATELET
BASOS ABS: 0 10*3/uL (ref 0.0–0.1)
BASOS PCT: 0.5 % (ref 0.0–3.0)
EOS ABS: 0.1 10*3/uL (ref 0.0–0.7)
Eosinophils Relative: 3.1 % (ref 0.0–5.0)
HEMATOCRIT: 40.9 % (ref 39.0–52.0)
HEMOGLOBIN: 14 g/dL (ref 13.0–17.0)
LYMPHS PCT: 55.6 % — AB (ref 12.0–46.0)
Lymphs Abs: 2.4 10*3/uL (ref 0.7–4.0)
MCHC: 34.3 g/dL (ref 30.0–36.0)
MCV: 85.2 fl (ref 78.0–100.0)
MONOS PCT: 7.2 % (ref 3.0–12.0)
Monocytes Absolute: 0.3 10*3/uL (ref 0.1–1.0)
Neutro Abs: 1.5 10*3/uL (ref 1.4–7.7)
Neutrophils Relative %: 33.6 % — ABNORMAL LOW (ref 43.0–77.0)
Platelets: 169 10*3/uL (ref 150.0–400.0)
RBC: 4.8 Mil/uL (ref 4.22–5.81)
RDW: 13.4 % (ref 11.5–15.5)
WBC: 4.4 10*3/uL (ref 4.0–10.5)

## 2016-04-19 LAB — BASIC METABOLIC PANEL
BUN: 13 mg/dL (ref 6–23)
CALCIUM: 9.3 mg/dL (ref 8.4–10.5)
CHLORIDE: 107 meq/L (ref 96–112)
CO2: 30 mEq/L (ref 19–32)
CREATININE: 1.36 mg/dL (ref 0.40–1.50)
GFR: 69.49 mL/min (ref >60.00)
Glucose, Bld: 101 mg/dL — ABNORMAL HIGH (ref 70–99)
Potassium: 4.9 mEq/L (ref 3.5–5.1)
SODIUM: 142 meq/L (ref 135–145)

## 2016-04-19 LAB — PSA: PSA: 0.58 ng/mL (ref 0.10–4.00)

## 2016-04-19 LAB — HEPATIC FUNCTION PANEL
ALK PHOS: 41 U/L (ref 39–117)
ALT: 13 U/L (ref 0–53)
AST: 14 U/L (ref 0–37)
Albumin: 4.3 g/dL (ref 3.5–5.2)
BILIRUBIN DIRECT: 0.2 mg/dL (ref 0.0–0.3)
TOTAL PROTEIN: 6.7 g/dL (ref 6.0–8.3)
Total Bilirubin: 0.8 mg/dL (ref 0.2–1.2)

## 2016-04-19 LAB — LIPID PANEL
CHOL/HDL RATIO: 6
Cholesterol: 237 mg/dL — ABNORMAL HIGH (ref 0–200)
HDL: 41.8 mg/dL (ref >39.00)
LDL Cholesterol: 179 mg/dL — ABNORMAL HIGH (ref 0–99)
NONHDL: 194.96
Triglycerides: 78 mg/dL (ref 0.0–149.0)
VLDL: 15.6 mg/dL (ref 0.0–40.0)

## 2016-04-19 LAB — POC URINALSYSI DIPSTICK (AUTOMATED)
Bilirubin, UA: NEGATIVE
GLUCOSE UA: NEGATIVE
Ketones, UA: NEGATIVE
LEUKOCYTES UA: NEGATIVE
Nitrite, UA: NEGATIVE
SPEC GRAV UA: 1.015
UROBILINOGEN UA: 0.2
pH, UA: 7.5

## 2016-04-19 LAB — TSH: TSH: 1.54 u[IU]/mL (ref 0.35–4.50)

## 2016-04-26 ENCOUNTER — Encounter: Payer: 59 | Admitting: Internal Medicine

## 2016-05-03 ENCOUNTER — Ambulatory Visit (INDEPENDENT_AMBULATORY_CARE_PROVIDER_SITE_OTHER): Payer: 59 | Admitting: Internal Medicine

## 2016-05-03 ENCOUNTER — Encounter: Payer: Self-pay | Admitting: Internal Medicine

## 2016-05-03 VITALS — BP 148/90 | HR 78 | Temp 98.3°F | Resp 20 | Ht 66.5 in | Wt 178.0 lb

## 2016-05-03 DIAGNOSIS — Z0000 Encounter for general adult medical examination without abnormal findings: Secondary | ICD-10-CM | POA: Diagnosis not present

## 2016-05-03 DIAGNOSIS — E785 Hyperlipidemia, unspecified: Secondary | ICD-10-CM | POA: Diagnosis not present

## 2016-05-03 NOTE — Progress Notes (Signed)
Subjective:    Patient ID: Derek DanielsBahekelwa S Woodrick, male    DOB: [birth year], 55 y.o.   MRN: [id number]  HPI  55 year old patient who is seen today for a preventive health examination. He is now followed by endocrinology for gynecomastia and testosterone deficiency. He is still well today without new concerns or complaints.  He does have a history of mild dyslipidemia.  Past Medical History:  Diagnosis Date  . BACK PAIN 10/27/2008  . GYNECOMASTIA, UNILATERAL 07/07/2008  . Head injury, closed   . HYPERLIPIDEMIA 10/23/2007  . OTALGIA 09/21/2007  . 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 Testosterone deficiency      Social History   Social History  . Marital status: Married    Spouse name: N/A  . Number of children: N/A  . Years of education: N/A   Occupational History  . Not on file.   Social History Main Topics  . Smoking status: Never Smoker  . Smokeless tobacco: Never Used  . Alcohol use No  . Drug use: No  . Sexual activity: Not on file   Other Topics Concern  . Not on file   Social History Narrative  . No narrative on file    Past Surgical History:  Procedure Laterality Date  . NO PAST SURGERIES      Family History  Problem Relation Age of Onset  . Colon cancer Neg Hx   . Other Neg Hx     hypogonadism    No Known Allergies  Current Outpatient Prescriptions on File Prior to Visit  Medication Sig Dispense Refill  . meloxicam (MOBIC) 15 MG tablet Take 1 tablet (15 mg total) by mouth daily as needed for pain. 30 tablet 0  . tamoxifen (NOLVADEX) 10 MG tablet Take 1 tablet (10 mg total) by mouth daily. 90 tablet 1   No current facility-administered medications on file prior to visit.     BP (!) 148/90 (BP Location: Right Arm, Patient Position: Sitting, Cuff Size: Normal)   Pulse 78   Temp 98.3 F (36.8 C) (Oral)   Resp 20   Ht 5' 6.5" (1.689 m)   Wt 178 lb (80.7 kg)   SpO2 97%   BMI 28.30 kg/m     Review of Systems    Constitutional: Negative for activity change, appetite change, chills, fatigue and fever.  HENT: Negative for congestion, dental problem, ear pain, hearing loss, mouth sores, rhinorrhea, sinus pressure, sneezing, tinnitus, trouble swallowing and voice change.   Eyes: Negative for photophobia, pain, redness and visual disturbance.  Respiratory: Negative for apnea, cough, choking, chest tightness, shortness of breath and wheezing.   Cardiovascular: Negative for chest pain, palpitations and leg swelling.  Gastrointestinal: Negative for abdominal distention, abdominal pain, anal bleeding, blood in stool, constipation, diarrhea, nausea, rectal pain and vomiting.  Genitourinary: Negative for decreased urine volume, difficulty urinating, discharge, dysuria, flank pain, frequency, genital sores, hematuria, penile swelling, scrotal swelling, testicular pain and urgency.  Musculoskeletal: Negative for arthralgias, back pain, gait problem, joint swelling, myalgias, neck pain and neck stiffness.  Skin: Negative for color change, rash and wound.  Neurological: Negative for dizziness, tremors, seizures, syncope, facial asymmetry, speech difficulty, weakness, light-headedness, numbness and headaches.  Hematological: Negative for adenopathy. Does not bruise/bleed easily.  Psychiatric/Behavioral: Negative for agitation, behavioral problems, confusion, decreased concentration, dysphoric mood, hallucinations, self-injury, sleep disturbance and suicidal ideas. The patient is not nervous/anxious.        Objective:   Physical Exam  Constitutional: He appears  well-developed and well-nourished.  Blood pressure 130/80 right arm 140 over 86 left arm  HENT:  Head: Normocephalic and atraumatic.  Right Ear: External ear normal.  Left Ear: External ear normal.  Nose: Nose normal.  Mouth/Throat: Oropharynx is clear and moist.  Eyes: Conjunctivae and EOM are normal. Pupils are equal, round, and reactive to light. No  scleral icterus.  Neck: Normal range of motion. Neck supple. No JVD present. No thyromegaly present.  Cardiovascular: Regular rhythm, normal heart sounds and intact distal pulses.  Exam reveals no gallop and no friction rub.   No murmur heard. Pulmonary/Chest: Effort normal and breath sounds normal. He exhibits no tenderness.  Mild gynecomastia  Abdominal: Soft. Bowel sounds are normal. He exhibits no distension and no mass. There is no tenderness.  Genitourinary: Prostate normal and penis normal. Rectal exam shows guaiac negative stool.  Musculoskeletal: Normal range of motion. He exhibits no edema or tenderness.  Lymphadenopathy:    He has no cervical adenopathy.  Neurological: He is alert. He has normal reflexes. No cranial nerve deficit. Coordination normal.  Skin: Skin is warm and dry. No rash noted.  Psychiatric: He has a normal mood and affect. His behavior is normal.          Assessment & Plan:   Preventive health exam Mild gynecomastia/testosterone deficiency.  Follow-up endocrine Dyslipidemia.  Dietary issues discussed.  Information concerning heart healthy diet, dispensed  Follow-up one year  Rogelia Boga

## 2016-05-03 NOTE — Patient Instructions (Addendum)
Limit your sodium (Salt) intake    It is important that you exercise regularly, at least 20 minutes 3 to 4 times per week.  If you develop chest pain or shortness of breath seek  medical attention.  Return in one year for follow-up  Dyslipidemia Dyslipidemia is an imbalance of the lipids in your blood. Lipids are waxy, fat-like proteins that your body needs in small amounts. Dyslipidemia often involves the lipids cholesterol or triglycerides. Common forms of dyslipidemia are:  High levels of bad cholesterol (LDL cholesterol). LDL cholesterol is the type of cholesterol that causes heart disease.  Low levels of good cholesterol (HDL cholesterol). HDL cholesterol is the type of cholesterol that helps protect against heart disease.  High levels of triglycerides. Triglycerides are a fatty substance in the blood linked to a buildup of plaque on your arteries. RISK FACTORS  Increased age.  Having a family history of high cholesterol.  Certain medicines, including birth control pills, diuretics, beta-blockers, and some medicines for depression.  Smoking.  Eating a high-fat diet.  Being overweight.  Medical conditions such as diabetes, polycystic ovary syndrome, pregnancy, kidney disease, and hypothyroidism.  Lack of regular exercise. SIGNS AND SYMPTOMS There are no signs or symptoms with dyslipidemia. DIAGNOSIS A simple blood test called a fasting blood test can be done to determine your level of:  Total cholesterol. This is the combined number of LDL cholesterol and HDL cholesterol. A healthy number is lower than 200.  LDL cholesterol. The goal number for LDL cholesterol is different for each person depending on risk factors. Ask your health care provider what your LDL cholesterol number should be.  HDL cholesterol. A healthy level of HDL cholesterol is 60 or higher. A number lower than 40 for men or 50 for women is a danger sign.  Triglycerides. A healthy triglyceride number is  less than 150. TREATMENT Dyslipidemia is a treatable condition. Your health care provider will advise you on what type of treatment is best based on your age, your test results, and current guidelines. Treatment may include:  Dietary changes. A dietitian may help you create a diet that is based on your risk factors, conditions, and lifestyle.  Regular exercise. This can help lower your LDL cholesterol, raise your HDL cholesterol, and help with weight management. Check with your health care provider before beginning an exercise program. Most people should participate in 30 minutes of brisk exercise 5 days a week.  Quitting smoking.  Medicines to lower LDL cholesterol and triglycerides.  If you have high levels of triglycerides, your health care provider may:  Have you stop drinking alcohol.  Have you restrict your fat intake.  Have you eliminate refined sugars from your diet.  Treat you for other conditions, such as underactive thyroid gland (hypothyroidism) and high blood sugar (hyperglycemia). Your health care provider will monitor your lipid levels with regular blood tests. HOME CARE INSTRUCTIONS  Eat a healthy diet. Follow any diet instructions if they were given to you by your health care provider.  Maintain a healthy weight.  Exercise regularly based on the recommendations of your health care provider.  Do not use any tobacco products, including cigarettes, chewing tobacco, or electronic cigarettes.  Take medicines only as directed by your health care provider.  Keep all follow-up visits as directed by your health care provider. SEEK MEDICAL CARE IF: You are having possible side effects from your medicines.   This information is not intended to replace advice given to you by your health  care provider. Make sure you discuss any questions you have with your health care provider.   Document Released: 08/24/2013 Document Revised: 09/09/2014 Document Reviewed:  08/24/2013 Elsevier Interactive Patient Education 2016 Else[employer] Inc[employer] Fat and Cholesterol Restricted Diet High levels of fat and cholesterol in your blood may lead to various health problems, such as diseases of the heart, blood vessels, gallbladder, liver, and pancreas. Fats are concentrated sources of energy that come in various forms. Certain types of fat, including saturated fat, may be harmful in excess. Cholesterol is a substance needed by your body in small amounts. Your body makes all the cholesterol it needs. Excess cholesterol comes from the food you eat. When you have high levels of cholesterol and saturated fat in your blood, health problems can develop because the excess fat and cholesterol will gather along the walls of your blood vessels, causing them to narrow. Choosing the right foods will help you control your intake of fat and cholesterol. This will help keep the levels of these substances in your blood within normal limits and reduce your risk of disease. WHAT IS MY PLAN? Your health care provider recommends that you:  Get no more than __________ % of the total calories in your daily diet from fat.  Limit your intake of saturated fat to less than ______% of your total calories each day.  Limit the amount of cholesterol in your diet to less than _________mg per day. WHAT TYPES OF FAT SHOULD I CHOOSE?  Choose healthy fats more often. Choose monounsaturated and polyunsaturated fats, such as olive and canola oil, flaxseeds, walnuts, almonds, and seeds.  Eat more omega-3 fats. Good choices include salmon, mackerel, sardines, tuna, flaxseed oil, and ground flaxseeds. Aim to eat fish at least two times a week.  Limit saturated fats. Saturated fats are primarily found in animal products, such as meats, butter, and cream. Plant sources of saturated fats include palm oil, palm kernel oil, and coconut oil.  Avoid foods with partially hydrogenated oils in them. These contain trans fats.  Examples of foods that contain trans fats are stick margarine, some tub margarines, cookies, crackers, and other baked goods. WHAT GENERAL GUIDELINES DO I NEED TO FOLLOW? These guidelines for healthy eating will help you control your intake of fat and cholesterol:  Check food labels carefully to identify foods with trans fats or high amounts of saturated fat.  Fill one half of your plate with vegetables and green salads.  Fill one fourth of your plate with whole grains. Look for the word "whole" as the first word in the ingredient list.  Fill one fourth of your plate with lean protein foods.  Limit fruit to two servings a day. Choose fruit instead of juice.  Eat more foods that contain soluble fiber. Examples of foods that contain this type of fiber are apples, broccoli, carrots, beans, peas, and barley. Aim to get 20-30 g of fiber per day.  Eat more home-cooked food and less restaurant, buffet, and fast food.  Limit or avoid alcohol.  Limit foods high in starch and sugar.  Limit fried foods.  Cook foods using methods other than frying. Baking, boiling, grilling, and broiling are all great options.  Lose weight if you are overweight. Losing just 5-10% of your initial body weight can help your overall health and prevent diseases such as diabetes and heart disease. WHAT FOODS CAN I EAT? Grains Whole grains, such as whole wheat or whole grain breads, crackers, cereals, and pasta. Unsweetened oatmeal, bulgur, barley,  quinoa, or brown rice. Corn or whole wheat flour tortillas. Vegetables Fresh or frozen vegetables (raw, steamed, roasted, or grilled). Green salads. Fruits All fresh, canned (in natural juice), or frozen fruits. Meat and Other Protein Products Ground beef (85% or leaner), grass-fed beef, or beef trimmed of fat. Skinless chicken or [place]. Ground chicken or [place]. Pork trimmed of fat. All fish and seafood. Eggs. Dried beans, peas, or lentils. Unsalted nuts or seeds.  Unsalted canned or dry beans. Dairy Low-fat dairy products, such as skim or 1% milk, 2% or reduced-fat cheeses, low-fat ricotta or cottage cheese, or plain low-fat yogurt. Fats and Oils Tub margarines without trans fats. Light or reduced-fat mayonnaise and salad dressings. Avocado. Olive, canola, sesame, or safflower oils. Natural peanut or almond butter (choose ones without added sugar and oil). The items listed above may not be a complete list of recommended foods or beverages. Contact your dietitian for more options. WHAT FOODS ARE NOT RECOMMENDED? Grains White bread. White pasta. White rice. Cornbread. Bagels, pastries, and croissants. Crackers that contain trans fat. Vegetables White potatoes. Corn. Creamed or fried vegetables. Vegetables in a cheese sauce. Fruits Dried fruits. Canned fruit in light or heavy syrup. Fruit juice. Meat and Other Protein Products Fatty cuts of meat. Ribs, chicken wings, bacon, sausage, bologna, salami, chitterlings, fatback, hot dogs, bratwurst, and packaged luncheon meats. Liver and organ meats. Dairy Whole or 2% milk, cream, half-and-half, and cream cheese. Whole milk cheeses. Whole-fat or sweetened yogurt. Full-fat cheeses. Nondairy creamers and whipped toppings. Processed cheese, cheese spreads, or cheese curds. Sweets and Desserts Corn syrup, sugars, honey, and molasses. Candy. Jam and jelly. Syrup. Sweetened cereals. Cookies, pies, cakes, donuts, muffins, and ice cream. Fats and Oils Butter, stick margarine, lard, shortening, ghee, or bacon fat. Coconut, palm kernel, or palm oils. Beverages Alcohol. Sweetened drinks (such as sodas, lemonade, and fruit drinks or punches). The items listed above may not be a complete list of foods and beverages to avoid. Contact your dietitian for more information.   This information is not intended to replace advice given to you by your health care provider. Make sure you discuss any questions you have with your health  care provider.   Document Released: 08/19/2005 Document Revised: 09/09/2014 Document Reviewed: 11/17/2013 Elsevier Interactive Patient Education [employer]2016 Elsevier Inc.

## 2016-05-03 NOTE — Progress Notes (Signed)
Pre visit review using our clinic review tool, if applicable. No additional management support is needed unless otherwise documented below in the visit note. 

## 2016-05-08 ENCOUNTER — Ambulatory Visit (INDEPENDENT_AMBULATORY_CARE_PROVIDER_SITE_OTHER): Payer: 59 | Admitting: Endocrinology

## 2016-05-08 ENCOUNTER — Encounter: Payer: Self-pay | Admitting: Endocrinology

## 2016-05-08 VITALS — BP 124/84 | HR 67 | Ht 66.5 in | Wt 179.0 lb

## 2016-05-08 DIAGNOSIS — N62 Hypertrophy of breast: Secondary | ICD-10-CM | POA: Diagnosis not present

## 2016-05-08 MED ORDER — TAMOXIFEN CITRATE 10 MG PO TABS: 10.0000 mg | ORAL_TABLET | Freq: Two times a day (BID) | ORAL | 3 refills | Status: DC

## 2016-05-08 NOTE — Patient Instructions (Addendum)
Testosterone treatment has risks, including increased or decreased fertility (depending on the type of treatment), hair loss, prostate cancer, benign prostate enlargement, blood clots, liver problems, lower hdl ("good cholesterol"), polycythemia (opposite of anemia), sleep apnea, and behavior changes.   I have sent a prescription to your pharmacy, to double the tamoxifen to twice a day.   Weight loss helps, also.  Please come back for a follow-up appointment in 3 months, when you will be due to repeat blood tests.

## 2016-05-08 NOTE — Progress Notes (Signed)
   Subjective:    Patient ID: Derek Wong, male    DOB: 1960-10-28, 55 y.o.   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  He says gynecomastia is slightly improved.  Past Medical History:  Diagnosis Date  . BACK PAIN 10/27/2008  . GYNECOMASTIA, UNILATERAL 07/07/2008  . Head injury, closed   . HYPERLIPIDEMIA 10/23/2007  . OTALGIA 09/21/2007  . 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 Testosterone deficiency     Past Surgical History:  Procedure Laterality Date  . NO PAST SURGERIES      Social History   Social History  . Marital status: Married    Spouse name: N/A  . Number of children: N/A  . Years of education: N/A   Occupational History  . Not on file.   Social History Main Topics  . Smoking status: Never Smoker  . Smokeless tobacco: Never Used  . Alcohol use No  . Drug use: No  . Sexual activity: Not on file   Other Topics Concern  . Not on file   Social History Narrative  . No narrative on file    Current Outpatient Prescriptions on File Prior to Visit  Medication Sig Dispense Refill  . meloxicam (MOBIC) 15 MG tablet Take 1 tablet (15 mg total) by mouth daily as needed for pain. 30 tablet 0   No current facility-administered medications on file prior to visit.     No Known Allergies  Family History  Problem Relation Age of Onset  . Colon cancer Neg Hx   . Other Neg Hx     hypogonadism    BP 124/84   Pulse 67   Ht 5' 6.5" (1.689 m)   Wt 179 lb (81.2 kg)   BMI 28.46 kg/m   Review of Systems Denies decreased urinary stream.      Objective:   Physical Exam VITAL SIGNS:  See vs page GENERAL: no distress BREASTS:  Moderate bilat  gynecomastia/pseudogynecomastia.        Assessment & Plan:  Gynecomastia, persistent Hypogonadism: we'll recheck this on increased tamoxifen.

## 2016-08-07 ENCOUNTER — Ambulatory Visit (INDEPENDENT_AMBULATORY_CARE_PROVIDER_SITE_OTHER): Payer: 59 | Admitting: Endocrinology

## 2016-08-07 ENCOUNTER — Encounter: Payer: Self-pay | Admitting: Endocrinology

## 2016-08-07 VITALS — BP 144/90 | HR 74 | Ht 66.5 in | Wt 175.0 lb

## 2016-08-07 DIAGNOSIS — N62 Hypertrophy of breast: Secondary | ICD-10-CM | POA: Diagnosis not present

## 2016-08-07 DIAGNOSIS — E785 Hyperlipidemia, unspecified: Secondary | ICD-10-CM

## 2016-08-07 LAB — LIPID PANEL
CHOLESTEROL: 208 mg/dL — AB (ref 0–200)
HDL: 35.5 mg/dL — ABNORMAL LOW (ref >39.00)
LDL Cholesterol: 156 mg/dL — ABNORMAL HIGH (ref 0–99)
NONHDL: 172.37
Total CHOL/HDL Ratio: 6
Triglycerides: 80 mg/dL (ref 0.0–149.0)
VLDL: 16 mg/dL (ref 0.0–40.0)

## 2016-08-07 NOTE — Progress Notes (Signed)
   Subjective:    Patient ID: Derek Wong, male    DOB: November 26, 1960, 55 y.o.   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  He says gynecomastia is slightly improved.  Past Medical History:  Diagnosis Date  . BACK PAIN 10/27/2008  . GYNECOMASTIA, UNILATERAL 07/07/2008  . Head injury, closed   . HYPERLIPIDEMIA 10/23/2007  . OTALGIA 09/21/2007  . 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 Testosterone deficiency     Past Surgical History:  Procedure Laterality Date  . NO PAST SURGERIES      Social History   Social History  . Marital status: Married    Spouse name: N/A  . Number of children: N/A  . Years of education: N/A   Occupational History  . Not on file.   Social History Main Topics  . Smoking status: Never Smoker  . Smokeless tobacco: Never Used  . Alcohol use No  . Drug use: No  . Sexual activity: Not on file   Other Topics Concern  . Not on file   Social History Narrative  . No narrative on file    Current Outpatient Prescriptions on File Prior to Visit  Medication Sig Dispense Refill  . meloxicam (MOBIC) 15 MG tablet Take 1 tablet (15 mg total) by mouth daily as needed for pain. 30 tablet 0  . tamoxifen (NOLVADEX) 10 MG tablet Take 1 tablet (10 mg total) by mouth 2 (two) times daily. 180 tablet 3   No current facility-administered medications on file prior to visit.     No Known Allergies  Family History  Problem Relation Age of Onset  . Colon cancer Neg Hx   . Other Neg Hx     hypogonadism    BP (!) 144/90   Pulse 74   Ht 5' 6.5" (1.689 m)   Wt 175 lb (79.4 kg)   SpO2 97%   BMI 27.82 kg/m    Review of Systems Denies decreased urinary stream.     Objective:   Physical Exam VITAL SIGNS:  See vs page.  GENERAL: no distress. BREASTS:  Moderate bilat pseudogynecomastia only.  No gynecomastia  Lab Results  Component Value Date   PSA 0.58 04/19/2016   PSA 0.42 04/06/2015   PSA 0.38 04/01/2014      Assessment & Plan:  Gynecomastia, much better with tamoxifen. Dyslipidemia: pt requests recheck.  Patient is advised the following: Patient Instructions  Testosterone treatment has risks, including increased or decreased fertility (depending on the type of treatment), hair loss, prostate cancer, benign prostate enlargement, blood clots, liver problems, lower hdl ("good cholesterol"), polycythemia (opposite of anemia), sleep apnea, and behavior changes.   Please continue the same medication.  blood tests are requested for you today.  We'll let you know about the results.  Weight loss helps, also.  Please come back for a follow-up appointment in 1 year.

## 2016-08-07 NOTE — Patient Instructions (Addendum)
Testosterone treatment has risks, including increased or decreased fertility (depending on the type of treatment), hair loss, prostate cancer, benign prostate enlargement, blood clots, liver problems, lower hdl ("good cholesterol"), polycythemia (opposite of anemia), sleep apnea, and behavior changes.   Please continue the same medication.  blood tests are requested for you today.  We'll let you know about the results.  Weight loss helps, also.  Please come back for a follow-up appointment in 1 year.

## 2016-08-09 LAB — TESTOSTERONE,FREE AND TOTAL
Testosterone, Free: 9 pg/mL (ref 7.2–24.0)
Testosterone: 411 ng/dL (ref 264–916)

## 2016-10-17 ENCOUNTER — Ambulatory Visit (INDEPENDENT_AMBULATORY_CARE_PROVIDER_SITE_OTHER): Payer: 59 | Admitting: Internal Medicine

## 2016-10-17 ENCOUNTER — Encounter: Payer: Self-pay | Admitting: Internal Medicine

## 2016-10-17 VITALS — BP 146/78 | HR 74 | Temp 98.1°F | Ht 66.5 in | Wt 177.4 lb

## 2016-10-17 DIAGNOSIS — E785 Hyperlipidemia, unspecified: Secondary | ICD-10-CM | POA: Diagnosis not present

## 2016-10-17 DIAGNOSIS — N62 Hypertrophy of breast: Secondary | ICD-10-CM | POA: Diagnosis not present

## 2016-10-17 MED ORDER — HYDROCODONE-ACETAMINOPHEN 5-325 MG PO TABS: 1.0000 | ORAL_TABLET | Freq: Four times a day (QID) | ORAL | 0 refills | Status: DC | PRN

## 2016-10-17 NOTE — Progress Notes (Signed)
Subjective:    Patient ID: Derek Wong, male    DOB: [birth year], 56 y.o.   MRN: [id number]  HPI  56 year old patient who had a flu syndrome about 3 weeks ago.  No symptoms have resolved including fever, headache and myalgias.  His only complaint presently is refractory cough which is largely nonproductive.  At times it does interfere with sleep. Patient has minor postnasal drip  Past Medical History:  Diagnosis Date  . BACK PAIN 10/27/2008  . GYNECOMASTIA, UNILATERAL 07/07/2008  . Head injury, closed   . HYPERLIPIDEMIA 10/23/2007  . OTALGIA 09/21/2007  . 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 Testosterone deficiency      Social History   Social History  . Marital status: Married    Spouse name: N/A  . Number of children: N/A  . Years of education: N/A   Occupational History  . Not on file.   Social History Main Topics  . Smoking status: Never Smoker  . Smokeless tobacco: Never Used  . Alcohol use No  . Drug use: No  . Sexual activity: Not on file   Other Topics Concern  . Not on file   Social History Narrative  . No narrative on file    Past Surgical History:  Procedure Laterality Date  . NO PAST SURGERIES      Family History  Problem Relation Age of Onset  . Colon cancer Neg Hx   . Other Neg Hx     hypogonadism    No Known Allergies  No current outpatient prescriptions on file prior to visit.   No current facility-administered medications on file prior to visit.     BP (!) 146/78 (BP Location: Right Arm, Patient Position: Sitting, Cuff Size: Normal)   Pulse 74   Temp 98.1 F (36.7 C) (Oral)   Ht 5' 6.5" (1.689 m)   Wt 177 lb 6.4 oz (80.5 kg)   SpO2 98%   BMI 28.20 kg/m     Review of Systems  Constitutional: Negative for appetite change, chills, fatigue and fever.  HENT: Negative for congestion, dental problem, ear pain, hearing loss, sore throat, tinnitus, trouble swallowing and voice change.   Eyes: Negative for  pain, discharge and visual disturbance.  Respiratory: Positive for cough. Negative for chest tightness, wheezing and stridor.   Cardiovascular: Negative for chest pain, palpitations and leg swelling.  Gastrointestinal: Negative for abdominal distention, abdominal pain, blood in stool, constipation, diarrhea, nausea and vomiting.  Genitourinary: Negative for difficulty urinating, discharge, flank pain, genital sores, hematuria and urgency.  Musculoskeletal: Negative for arthralgias, back pain, gait problem, joint swelling, myalgias and neck stiffness.  Skin: Negative for rash.  Neurological: Negative for dizziness, syncope, speech difficulty, weakness, numbness and headaches.  Hematological: Negative for adenopathy. Does not bruise/bleed easily.  Psychiatric/Behavioral: Negative for behavioral problems and dysphoric mood. The patient is not nervous/anxious.        Objective:   Physical Exam  Constitutional: He is oriented to person, place, and time. He appears well-developed.  HENT:  Head: Normocephalic.  Right Ear: External ear normal.  Left Ear: External ear normal.  Eyes: Conjunctivae and EOM are normal.  Neck: Normal range of motion.  Cardiovascular: Normal rate and normal heart sounds.   Pulmonary/Chest: Breath sounds normal. No respiratory distress. He has no wheezes. He has no rales.  Abdominal: Bowel sounds are normal.  Musculoskeletal: Normal range of motion. He exhibits no edema or tenderness.  Neurological: He is alert and oriented  to person, place, and time.  Psychiatric: He has a normal mood and affect. His behavior is normal.          Assessment & Plan:   Resolving viral URI with persistent cough.  Will treat with Mucinex DM.  Hydration.  Prescription for hydrocodone dispense for added antitussives affect.  Nonsedating oral antihistamine.  Also recommended  Rogelia Boga

## 2016-10-17 NOTE — Progress Notes (Signed)
Pre visit review using our clinic review tool, if applicable. No additional management support is needed unless otherwise documented below in the visit note. 

## 2016-10-17 NOTE — Patient Instructions (Addendum)
  Hydrocodone 1 tablet every 4-6 hours as needed for cough Use  a nonsedating antihistamine such as Allegra or Zyrtec once daily   Take over-the-counter expectorants and cough medications such as  Mucinex DM.  Call if there is no improvement in 5 to 7 days or if  you develop worsening cough, fever, or new symptoms, such as shortness of breath or chest pain.  Hydrate and Humidify  Drink enough water to keep your urine clear or pale yellow. Staying hydrated will help to thin your mucus.  Use a cool mist humidifier to keep the humidity level in your home above 50%.  Inhale steam for 10-15 minutes, 3-4 times a day or as told by your health care provider. You can do this in the bathroom while a hot shower is running.  Limit your exposure to cool or dry air. Rest  Rest as much as possible.

## 2016-12-02 DIAGNOSIS — H524 Presbyopia: Secondary | ICD-10-CM | POA: Diagnosis not present

## 2017-04-15 ENCOUNTER — Encounter: Payer: 59 | Admitting: Internal Medicine

## 2017-05-22 ENCOUNTER — Encounter: Payer: Self-pay | Admitting: Internal Medicine

## 2017-07-08 ENCOUNTER — Ambulatory Visit (INDEPENDENT_AMBULATORY_CARE_PROVIDER_SITE_OTHER): Payer: PRIVATE HEALTH INSURANCE | Admitting: Internal Medicine

## 2017-07-08 ENCOUNTER — Encounter: Payer: Self-pay | Admitting: Internal Medicine

## 2017-07-08 VITALS — BP 140/70 | HR 75 | Temp 98.4°F | Ht 66.5 in | Wt 171.0 lb

## 2017-07-08 DIAGNOSIS — Z0000 Encounter for general adult medical examination without abnormal findings: Secondary | ICD-10-CM | POA: Diagnosis not present

## 2017-07-08 LAB — CBC WITH DIFFERENTIAL/PLATELET
BASOS ABS: 0 10*3/uL (ref 0.0–0.1)
Basophils Relative: 0.6 % (ref 0.0–3.0)
EOS ABS: 0.1 10*3/uL (ref 0.0–0.7)
Eosinophils Relative: 2.3 % (ref 0.0–5.0)
HCT: 40.9 % (ref 39.0–52.0)
Hemoglobin: 13.7 g/dL (ref 13.0–17.0)
LYMPHS ABS: 1.8 10*3/uL (ref 0.7–4.0)
Lymphocytes Relative: 44.8 % (ref 12.0–46.0)
MCHC: 33.6 g/dL (ref 30.0–36.0)
MCV: 87.6 fl (ref 78.0–100.0)
MONO ABS: 0.3 10*3/uL (ref 0.1–1.0)
Monocytes Relative: 6.4 % (ref 3.0–12.0)
NEUTROS PCT: 45.9 % (ref 43.0–77.0)
Neutro Abs: 1.8 10*3/uL (ref 1.4–7.7)
Platelets: 169 10*3/uL (ref 150.0–400.0)
RBC: 4.67 Mil/uL (ref 4.22–5.81)
RDW: 13.6 % (ref 11.5–15.5)
WBC: 3.9 10*3/uL — ABNORMAL LOW (ref 4.0–10.5)

## 2017-07-08 LAB — COMPREHENSIVE METABOLIC PANEL
ALBUMIN: 4.3 g/dL (ref 3.5–5.2)
ALK PHOS: 62 U/L (ref 39–117)
ALT: 18 U/L (ref 0–53)
AST: 17 U/L (ref 0–37)
BUN: 14 mg/dL (ref 6–23)
CO2: 30 mEq/L (ref 19–32)
CREATININE: 1.29 mg/dL (ref 0.40–1.50)
Calcium: 9.6 mg/dL (ref 8.4–10.5)
Chloride: 104 mEq/L (ref 96–112)
GFR: 73.54 mL/min (ref >60.00)
Glucose, Bld: 98 mg/dL (ref 70–99)
POTASSIUM: 4.2 meq/L (ref 3.5–5.1)
SODIUM: 140 meq/L (ref 135–145)
TOTAL PROTEIN: 7.2 g/dL (ref 6.0–8.3)
Total Bilirubin: 0.9 mg/dL (ref 0.2–1.2)

## 2017-07-08 LAB — LIPID PANEL
CHOLESTEROL: 211 mg/dL — AB (ref 0–200)
HDL: 40 mg/dL (ref >39.00)
LDL Cholesterol: 156 mg/dL — ABNORMAL HIGH (ref 0–99)
NonHDL: 170.6
Total CHOL/HDL Ratio: 5
Triglycerides: 73 mg/dL (ref 0.0–149.0)
VLDL: 14.6 mg/dL (ref 0.0–40.0)

## 2017-07-08 LAB — PSA: PSA: 0.46 ng/mL (ref 0.10–4.00)

## 2017-07-08 LAB — TSH: TSH: 1.66 u[IU]/mL (ref 0.35–4.50)

## 2017-07-08 NOTE — Progress Notes (Signed)
Subjective:    Patient ID: DUVALL COMES, male    DOB: 09-Feb-1961, 56 y.o.   MRN: [id number]  HPI  56 year old patient who is seen today for a preventive health examination Medical illnesses include history of mild dyslipidemia.  He has a history of chronic gynecomastia which has been evaluated in the past. No concerns or complaints. Screening colonoscopy 28-Jan-2013  Family history Father died at 55 of complications of diabetes mother rate 27 is in good health for brothers in good health  Past Medical History:  Diagnosis Date  . BACK PAIN 10/27/2008  . GYNECOMASTIA, UNILATERAL 07/07/2008  . Head injury, closed   . HYPERLIPIDEMIA 10/23/2007  . OTALGIA 09/21/2007  . 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 Testosterone deficiency      Social History   Socioeconomic History  . Marital status: Married    Spouse name: Not on file  . Number of children: Not on file  . Years of education: Not on file  . Highest education level: Not on file  Social Needs  . Financial resource strain: Not on file  . Food insecurity - worry: Not on file  . Food insecurity - inability: Not on file  . Transportation needs - medical: Not on file  . Transportation needs - non-medical: Not on file  Occupational History  . Not on file  Tobacco Use  . Smoking status: Never Smoker  . Smokeless tobacco: Never Used  Substance and Sexual Activity  . Alcohol use: No  . Drug use: No  . Sexual activity: Not on file  Other Topics Concern  . Not on file  Social History Narrative  . Not on file    Past Surgical History:  Procedure Laterality Date  . NO PAST SURGERIES      Family History  Problem Relation Age of Onset  . Colon cancer Neg Hx   . Other Neg Hx        hypogonadism    No Known Allergies  Current Outpatient Medications on File Prior to Visit  Medication Sig Dispense Refill  . HYDROcodone-acetaminophen (NORCO/VICODIN) 5-325 MG tablet Take 1 tablet by mouth every 6 (six)  hours as needed for moderate pain. (Patient not taking: Reported on 07/08/2017) 30 tablet 0   No current facility-administered medications on file prior to visit.     BP 140/70 (BP Location: Left Arm, Patient Position: Sitting, Cuff Size: Normal)   Pulse 75   Temp 98.4 F (36.9 C) (Oral)   Ht 5' 6.5" (1.689 m)   Wt 171 lb (77.6 kg)   SpO2 97%   BMI 27.19 kg/m     Review of Systems  Constitutional: Negative for appetite change, chills, fatigue and fever.  HENT: Negative for congestion, dental problem, ear pain, hearing loss, sore throat, tinnitus, trouble swallowing and voice change.   Eyes: Negative for pain, discharge and visual disturbance.  Respiratory: Negative for cough, chest tightness, wheezing and stridor.   Cardiovascular: Negative for chest pain, palpitations and leg swelling.  Gastrointestinal: Negative for abdominal distention, abdominal pain, blood in stool, constipation, diarrhea, nausea and vomiting.  Genitourinary: Negative for difficulty urinating, discharge, flank pain, genital sores, hematuria and urgency.  Musculoskeletal: Negative for arthralgias, back pain, gait problem, joint swelling, myalgias and neck stiffness.  Skin: Negative for rash.  Neurological: Negative for dizziness, syncope, speech difficulty, weakness, numbness and headaches.  Hematological: Negative for adenopathy. Does not bruise/bleed easily.  Psychiatric/Behavioral: Negative for behavioral problems and dysphoric mood. The patient  is not nervous/anxious.        Objective:   Physical Exam  Constitutional: He appears well-developed and well-nourished.  HENT:  Head: Normocephalic and atraumatic.  Right Ear: External ear normal.  Left Ear: External ear normal.  Nose: Nose normal.  Mouth/Throat: Oropharynx is clear and moist.  Eyes: Conjunctivae and EOM are normal. Pupils are equal, round, and reactive to light. No scleral icterus.  Neck: Normal range of motion. Neck supple. No JVD present.  No thyromegaly present.  Cardiovascular: Regular rhythm, normal heart sounds and intact distal pulses. Exam reveals no gallop and no friction rub.  No murmur heard. Pulmonary/Chest: Effort normal and breath sounds normal. He exhibits no tenderness.  Gynecomastia present  Abdominal: Soft. Bowel sounds are normal. He exhibits no distension and no mass. There is no tenderness.  Genitourinary: Prostate normal and penis normal. Rectal exam shows guaiac negative stool.  Genitourinary Comments: Prostate +1 enlarged  Musculoskeletal: Normal range of motion. He exhibits no edema or tenderness.  Lymphadenopathy:    He has no cervical adenopathy.  Neurological: He is alert. He has normal reflexes. No cranial nerve deficit. Coordination normal.  Skin: Skin is warm and dry. No rash noted.  Psychiatric: He has a normal mood and affect. His behavior is normal.          Assessment & Plan:   Preventive health examination Mild dyslipidemia.  Will review a lipid profile Gynecomastia.  We will continue heart healthy diet and regular exercise Review screening lab Follow-up 1 year or as needed  Rogelia BogaKWIATKOWSKI,PETER FRANK

## 2017-07-08 NOTE — Patient Instructions (Addendum)
Limit your sodium (Salt) intake   Health Maintenance, Male A healthy lifestyle and preventive care is important for your health and wellness. Ask your health care provider about what schedule of regular examinations is right for you. What should I know about weight and diet? Eat a Healthy Diet  Eat plenty of vegetables, fruits, whole grains, low-fat dairy products, and lean protein.  Do not eat a lot of foods high in solid fats, added sugars, or salt.  Maintain a Healthy Weight Regular exercise can help you achieve or maintain a healthy weight. You should:  Do at least 150 minutes of exercise each week. The exercise should increase your heart rate and make you sweat (moderate-intensity exercise).  Do strength-training exercises at least twice a week.  Watch Your Levels of Cholesterol and Blood Lipids  Have your blood tested for lipids and cholesterol every 5 years starting at 58 years of age. If you are at high risk for heart disease, you should start having your blood tested when you are 58 years old. You may need to have your cholesterol levels checked more often if: ? Your lipid or cholesterol levels are high. ? You are older than 58 years of age. ? You are at high risk for heart disease.  What should I know about cancer screening? Many types of cancers can be detected early and may often be prevented. Lung Cancer  You should be screened every year for lung cancer if: ? You are a current smoker who has smoked for at least 30 years. ? You are a former smoker who has quit within the past 15 years.  Talk to your health care provider about your screening options, when you should start screening, and how often you should be screened.  Colorectal Cancer  Routine colorectal cancer screening usually begins at 58 years of age and should be repeated every 5-10 years until you are 86104 years old. You may need to be screened more often if early forms of precancerous polyps or small growths  are found. Your health care provider may recommend screening at an earlier age if you have risk factors for colon cancer.  Your health care provider may recommend using home test kits to check for hidden blood in the stool.  A small camera at the end of a tube can be used to examine your colon (sigmoidoscopy or colonoscopy). This checks for the earliest forms of colorectal cancer.  Prostate and Testicular Cancer  Depending on your age and overall health, your health care provider may do certain tests to screen for prostate and testicular cancer.  Talk to your health care provider about any symptoms or concerns you have about testicular or prostate cancer.  Skin Cancer  Check your skin from head to toe regularly.  Tell your health care provider about any new moles or changes in moles, especially if: ? There is a change in a mole's size, shape, or color. ? You have a mole that is larger than a pencil eraser.  Always use sunscreen. Apply sunscreen liberally and repeat throughout the day.  Protect yourself by wearing long sleeves, pants, a wide-brimmed hat, and sunglasses when outside.  What should I know about heart disease, diabetes, and high blood pressure?  If you are 7718-58 years of age, have your blood pressure checked every 3-5 years. If you are 58 years of age or older, have your blood pressure checked every year. You should have your blood pressure measured twice-once when you are at  a hospital or clinic, and once when you are not at a hospital or clinic. Record the average of the two measurements. To check your blood pressure when you are not at a hospital or clinic, you can use: ? An automated blood pressure machine at a pharmacy. ? A home blood pressure monitor.  Talk to your health care provider about your target blood pressure.  If you are between 37-63 years old, ask your health care provider if you should take aspirin to prevent heart disease.  Have regular diabetes  screenings by checking your fasting blood sugar level. ? If you are at a normal weight and have a low risk for diabetes, have this test once every three years after the age of 14. ? If you are overweight and have a high risk for diabetes, consider being tested at a younger age or more often.  A one-time screening for abdominal aortic aneurysm (AAA) by ultrasound is recommended for men aged 21-75 years who are current or former smokers. What should I know about preventing infection? Hepatitis B If you have a higher risk for hepatitis B, you should be screened for this virus. Talk with your health care provider to find out if you are at risk for hepatitis B infection. Hepatitis C Blood testing is recommended for:  Everyone born from [birth year] through 1967.  Anyone with known risk factors for hepatitis C.  Sexually Transmitted Diseases (STDs)  You should be screened each year for STDs including gonorrhea and chlamydia if: ? You are sexually active and are younger than 58 years of age. ? You are older than 58 years of age and your health care provider tells you that you are at risk for this type of infection. ? Your sexual activity has changed since you were last screened and you are at an increased risk for chlamydia or gonorrhea. Ask your health care provider if you are at risk.  Talk with your health care provider about whether you are at high risk of being infected with HIV. Your health care provider may recommend a prescription medicine to help prevent HIV infection.  What else can I do?  Schedule regular health, dental, and eye exams.  Stay current with your vaccines (immunizations).  Do not use any tobacco products, such as cigarettes, chewing tobacco, and e-cigarettes. If you need help quitting, ask your health care provider.  Limit alcohol intake to no more than 2 drinks per day. One drink equals 12 ounces of beer, 5 ounces of wine, or 1 ounces of hard liquor.  Do not use street  drugs.  Do not share needles.  Ask your health care provider for help if you need support or information about quitting drugs.  Tell your health care provider if you often feel depressed.  Tell your health care provider if you have ever been abused or do not feel safe at home. This information is not intended to replace advice given to you by your health care provider. Make sure you discuss any questions you have with your health care provider. Document Released: 02/15/2008 Document Revised: 04/17/2016 Document Reviewed: 05/23/2015 Elsevier Interactive Patient Education  [employer].

## 2017-07-09 LAB — HEPATITIS C ANTIBODY
HEP C AB: NONREACTIVE
SIGNAL TO CUT-OFF: 0.11 (ref <1.00)

## 2017-08-08 ENCOUNTER — Ambulatory Visit: Payer: 59 | Admitting: Endocrinology

## 2018-01-31 LAB — GLUCOSE, POCT (MANUAL RESULT ENTRY): POC Glucose: 117 mg/dl — AB (ref 70–99)

## 2018-05-06 ENCOUNTER — Encounter: Payer: Self-pay | Admitting: Internal Medicine

## 2018-05-06 ENCOUNTER — Ambulatory Visit: Payer: 59 | Admitting: Internal Medicine

## 2018-05-06 ENCOUNTER — Ambulatory Visit: Payer: Self-pay | Admitting: *Deleted

## 2018-05-06 VITALS — BP 150/84 | HR 64 | Temp 98.7°F | Wt 170.0 lb

## 2018-05-06 DIAGNOSIS — I493 Ventricular premature depolarization: Secondary | ICD-10-CM

## 2018-05-06 DIAGNOSIS — R002 Palpitations: Secondary | ICD-10-CM | POA: Diagnosis not present

## 2018-05-06 HISTORY — DX: Ventricular premature depolarization: I49.3

## 2018-05-06 NOTE — Patient Instructions (Signed)
Palpitations A palpitation is the feeling that your heartbeat is irregular or is faster than normal. It may feel like your heart is fluttering or skipping a beat. Palpitations are usually not a serious problem. They may be caused by many things, including smoking, caffeine, alcohol, stress, and certain medicines. Although most causes of palpitations are not serious, palpitations can be a sign of a serious medical problem. In some cases, you may need further medical evaluation. Follow these instructions at home: Pay attention to any changes in your symptoms. Take these actions to help with your condition:  Avoid the following: ? Caffeinated coffee, tea, soft drinks, diet pills, and energy drinks. ? Chocolate. ? Alcohol.  Do not use any tobacco products, such as cigarettes, chewing tobacco, and e-cigarettes. If you need help quitting, ask your health care provider.  Try to reduce your stress and anxiety. Things that can help you relax include: ? Yoga. ? Meditation. ? Physical activity, such as swimming, jogging, or walking. ? Biofeedback. This is a method that helps you learn to use your mind to control things in your body, such as your heartbeats.  Get plenty of rest and sleep.  Take over-the-counter and prescription medicines only as told by your health care provider.  Keep all follow-up visits as told by your health care provider. This is important.  Contact a health care provider if:  You continue to have a fast or irregular heartbeat after 24 hours.  Your palpitations occur more often. Get help right away if:  You have chest pain or shortness of breath.  You have a severe headache.  You feel dizzy or you faint. This information is not intended to replace advice given to you by your health care provider. Make sure you discuss any questions you have with your health care provider. Document Released: 08/16/2000 Document Revised: 01/22/2016 Document Reviewed: 05/04/2015 Elsevier  Interactive Patient Education  2018 Elsevier Inc.  

## 2018-05-06 NOTE — Progress Notes (Signed)
Subjective:    Patient ID: Derek Wong, male    DOB: 06-10-1961, 57 y.o.   MRN: [id number]  HPI  57 year old patient who presents with a 2-week history of increasing forceful irregular heartbeats.  He states that he has had this issue for a number of months but has intensified over the past 2 weeks.  He describes episodes of 1 or 2 forceful heartbeats.  These episodes usually occur at rest and are not associated with other symptoms.  EKGs in the past have revealed isolated PVCs and otherwise normal.  He denies any exertional symptoms.  His caffeine use is very modest with a single cup of coffee in the morning only  Past Medical History:  Diagnosis Date  . BACK PAIN 10/27/2008  . GYNECOMASTIA, UNILATERAL 07/07/2008  . Head injury, closed   . HYPERLIPIDEMIA 10/23/2007  . OTALGIA 09/21/2007  . 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 Testosterone deficiency      Social History   Socioeconomic History  . Marital status: Married    Spouse name: Not on file  . Number of children: Not on file  . Years of education: Not on file  . Highest education level: Not on file  Occupational History  . Not on file  Social Needs  . Financial resource strain: Not on file  . Food insecurity:    Worry: Not on file    Inability: Not on file  . Transportation needs:    Medical: Not on file    Non-medical: Not on file  Tobacco Use  . Smoking status: Never Smoker  . Smokeless tobacco: Never Used  Substance and Sexual Activity  . Alcohol use: No  . Drug use: No  . Sexual activity: Not on file  Lifestyle  . Physical activity:    Days per week: Not on file    Minutes per session: Not on file  . Stress: Not on file  Relationships  . Social connections:    Talks on phone: Not on file    Gets together: Not on file    Attends religious service: Not on file    Active member of club or organization: Not on file    Attends meetings of clubs or organizations: Not on file   Relationship status: Not on file  . Intimate partner violence:    Fear of current or ex partner: Not on file    Emotionally abused: Not on file    Physically abused: Not on file    Forced sexual activity: Not on file  Other Topics Concern  . Not on file  Social History Narrative  . Not on file    Past Surgical History:  Procedure Laterality Date  . NO PAST SURGERIES      Family History  Problem Relation Age of Onset  . Colon cancer Neg Hx   . Other Neg Hx        hypogonadism    No Known Allergies  No current outpatient medications on file prior to visit.   No current facility-administered medications on file prior to visit.     BP (!) 150/84 (BP Location: Right Arm, Patient Position: Sitting, Cuff Size: Normal)   Pulse 64   Temp 98.7 F (37.1 C) (Oral)   Wt 170 lb (77.1 kg)   SpO2 98%   BMI 27.03 kg/m     Review of Systems  Constitutional: Negative for appetite change, chills, fatigue and fever.  HENT: Negative for congestion, dental problem,  ear pain, hearing loss, sore throat, tinnitus, trouble swallowing and voice change.   Eyes: Negative for pain, discharge and visual disturbance.  Respiratory: Negative for cough, chest tightness, wheezing and stridor.   Cardiovascular: Positive for palpitations. Negative for chest pain and leg swelling.  Gastrointestinal: Negative for abdominal distention, abdominal pain, blood in stool, constipation, diarrhea, nausea and vomiting.  Genitourinary: Negative for difficulty urinating, discharge, flank pain, genital sores, hematuria and urgency.  Musculoskeletal: Negative for arthralgias, back pain, gait problem, joint swelling, myalgias and neck stiffness.  Skin: Negative for rash.  Neurological: Negative for dizziness, syncope, speech difficulty, weakness, numbness and headaches.  Hematological: Negative for adenopathy. Does not bruise/bleed easily.  Psychiatric/Behavioral: Negative for behavioral problems and dysphoric mood.  The patient is not nervous/anxious.        Objective:   Physical Exam  Constitutional: He is oriented to person, place, and time. He appears well-developed.  Blood pressure 130/82  HENT:  Head: Normocephalic.  Right Ear: External ear normal.  Left Ear: External ear normal.  Eyes: Conjunctivae and EOM are normal.  Neck: Normal range of motion.  Cardiovascular: Normal rate, regular rhythm, normal heart sounds and intact distal pulses.  Rhythm is regular without ectopics noted over the course of a exam  Pulmonary/Chest: Breath sounds normal.  Abdominal: Bowel sounds are normal.  Musculoskeletal: Normal range of motion. He exhibits no edema or tenderness.  Neurological: He is alert and oriented to person, place, and time.  Psychiatric: He has a normal mood and affect. His behavior is normal.          Assessment & Plan:   Palpitations History of isolated PVCs Unremarkable clinical exam Normal EKGs in the past  Patient reassured.  Will clinically observe at this time.  If he develops any worsening or prolonged symptoms will consider 2D echocardiogram, possible event monitor or cardiology referral  Return as needed or in 3 months Patient suggested to have his annual clinical exam with a new provider at that time  Gordy Savers

## 2018-05-06 NOTE — Telephone Encounter (Signed)
Pt states that "his heart is pounding"; he describes it as "the beat is stronger than usual"; he also says it feels like something scares you; the pt also says it comes and goes; the pt states that it started a couple of weeks ago with his last episode occurring today; he is unsure of the time that it occurred; recommendations made per nurse triage protocol; the pt would like to be seen in the office; pt offered and accepted appointment with Dr Amador Cunas at [facility], today at 1300; he verbalizes understanding; will route to office for notification of this upcoming appointment.  Reason for Disposition . [1] Skipped or extra beat(s) AND [2] occurs < 4 times / minute  Answer Assessment - Initial Assessment Questions 1. DESCRIPTION: "Please describe your heart rate or heart beat that you are having" (e.g., fast/slow, regular/irregular, skipped or extra beats, "palpitations")     pounding 2. ONSET: "When did it start?" (Minutes, hours or days)      A couple of weeks ago 3. DURATION: "How long does it last" (e.g., seconds, minutes, hours)     seconds 4. PATTERN "Does it come and go, or has it been constant since it started?"  "Does it get worse with exertion?"   "Are you feeling it now?"     Comes and goes; happens with exertion or sitting; feels ok now5. TAP: "Using your hand, can you tap out what you are feeling on a chair or table in front of you, so that I can hear?" (Note: not all patients can do this)       Unable to complete 6. HEART RATE: "Can you tell me your heart rate?" "How many beats in 15 seconds?"  (Note: not all patients can do this)       Unable to complete 7. RECURRENT SYMPTOM: "Have you ever had this before?" If so, ask: "When was the last time?" and "What happened that time?"      no 8. CAUSE: "What do you think is causing the palpitations?"     unsure 9. CARDIAC HISTORY: "Do you have any history of heart disease?" (e.g., heart attack, angina, bypass surgery, angioplasty,  arrhythmia)      no 10. OTHER SYMPTOMS: "Do you have any other symptoms?" (e.g., dizziness, chest pain, sweating, difficulty breathing)       no 11. PREGNANCY: "Is there any chance you are pregnant?" "When was your last menstrual period?"       n/a  Protocols used: HEART RATE AND HEARTBEAT QUESTIONS-A-AH

## 2018-09-10 ENCOUNTER — Ambulatory Visit (INDEPENDENT_AMBULATORY_CARE_PROVIDER_SITE_OTHER): Payer: 59 | Admitting: Family Medicine

## 2018-09-10 ENCOUNTER — Encounter: Payer: Self-pay | Admitting: Family Medicine

## 2018-09-10 VITALS — BP 138/82 | HR 73 | Temp 98.2°F | Ht 67.0 in | Wt 169.8 lb

## 2018-09-10 DIAGNOSIS — R739 Hyperglycemia, unspecified: Secondary | ICD-10-CM

## 2018-09-10 DIAGNOSIS — Z125 Encounter for screening for malignant neoplasm of prostate: Secondary | ICD-10-CM

## 2018-09-10 DIAGNOSIS — Z0001 Encounter for general adult medical examination with abnormal findings: Secondary | ICD-10-CM | POA: Diagnosis not present

## 2018-09-10 DIAGNOSIS — Z6826 Body mass index (BMI) 26.0-26.9, adult: Secondary | ICD-10-CM | POA: Diagnosis not present

## 2018-09-10 DIAGNOSIS — E785 Hyperlipidemia, unspecified: Secondary | ICD-10-CM | POA: Diagnosis not present

## 2018-09-10 LAB — CBC
HEMATOCRIT: 39.1 % (ref 39.0–52.0)
HEMOGLOBIN: 13.4 g/dL (ref 13.0–17.0)
MCHC: 34.3 g/dL (ref 30.0–36.0)
MCV: 85.8 fl (ref 78.0–100.0)
Platelets: 163 10*3/uL (ref 150.0–400.0)
RBC: 4.55 Mil/uL (ref 4.22–5.81)
RDW: 13.5 % (ref 11.5–15.5)
WBC: 5.1 10*3/uL (ref 4.0–10.5)

## 2018-09-10 LAB — COMPREHENSIVE METABOLIC PANEL
ALBUMIN: 4.5 g/dL (ref 3.5–5.2)
ALK PHOS: 62 U/L (ref 39–117)
ALT: 16 U/L (ref 0–53)
AST: 17 U/L (ref 0–37)
BILIRUBIN TOTAL: 0.6 mg/dL (ref 0.2–1.2)
BUN: 14 mg/dL (ref 6–23)
CO2: 29 mEq/L (ref 19–32)
Calcium: 9.5 mg/dL (ref 8.4–10.5)
Chloride: 104 mEq/L (ref 96–112)
Creatinine, Ser: 1.3 mg/dL (ref 0.40–1.50)
GFR: 72.59 mL/min (ref >60.00)
GLUCOSE: 90 mg/dL (ref 70–99)
POTASSIUM: 4.1 meq/L (ref 3.5–5.1)
SODIUM: 140 meq/L (ref 135–145)
Total Protein: 6.8 g/dL (ref 6.0–8.3)

## 2018-09-10 LAB — LIPID PANEL
CHOLESTEROL: 194 mg/dL (ref 0–200)
HDL: 39 mg/dL — ABNORMAL LOW (ref >39.00)
LDL Cholesterol: 132 mg/dL — ABNORMAL HIGH (ref 0–99)
NONHDL: 154.55
Total CHOL/HDL Ratio: 5
Triglycerides: 113 mg/dL (ref 0.0–149.0)
VLDL: 22.6 mg/dL (ref 0.0–40.0)

## 2018-09-10 LAB — TSH: TSH: 1.65 u[IU]/mL (ref 0.35–4.50)

## 2018-09-10 LAB — PSA: PSA: 0.44 ng/mL (ref 0.10–4.00)

## 2018-09-10 LAB — HEMOGLOBIN A1C: Hgb A1c MFr Bld: 6 % (ref 4.6–6.5)

## 2018-09-10 NOTE — Progress Notes (Signed)
Subjective:  Turner DanielsBahekelwa S Whelpley is a 58 y.o. male who presents today for his annual comprehensive physical exam and to transfer  HPI:  He has no acute complaints today.   Lifestyle Diet: No specific diets.  Exercise: Exercises at home. Mostly cardio.   Depression screen PHQ 2/9 09/10/2018  Decreased Interest 0  Down, Depressed, Hopeless 0  PHQ - 2 Score 0    Health Maintenance Due  Topic Date Due  . HIV Screening  05/[birth year]1974    ROS: Per HPI, otherwise a complete review of systems was negative.   PMH:  The following were reviewed and entered/updated in epic: Past Medical History:  Diagnosis Date  . BACK PAIN 10/27/2008  . BACK PAIN 10/27/2008   Qualifier: Diagnosis of  By: Amador CunasKwiatkowski  MD, Janett LabellaPeter F   . Elevated transaminase level 02/08/2011  . Gynecomastia, male 01/10/2016  . GYNECOMASTIA, UNILATERAL 07/07/2008  . Head injury, closed   . HYPERLIPIDEMIA 10/23/2007  . Hypogonadism in male 10/02/2015  . OTALGIA 09/21/2007  . 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 PVCs (premature ventricular contractions) 05/06/2018  . Testosterone deficiency    Patient Active Problem List   Diagnosis Date Noted  . 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 Dyslipidemia 10/23/2007   Past Surgical History:  Procedure Laterality Date  . NO PAST SURGERIES      Family History  Problem Relation Age of Onset  . Diabetes Father   . Hypercholesterolemia Father   . Colon cancer Neg Hx   . Other Neg Hx        hypogonadism    Medications- reviewed and updated No current outpatient medications on file.   No current facility-administered medications for this visit.     Allergies-reviewed and updated No Known Allergies  Social History   Socioeconomic History  . Marital status: Married    Spouse name: Not on file  . Number of children: Not on file  . Years of education: Not on file  . Highest education level: Not on file  Occupational History  . Not on  file  Social Needs  . Financial resource strain: Not on file  . Food insecurity:    Worry: Not on file    Inability: Not on file  . Transportation needs:    Medical: Not on file    Non-medical: Not on file  Tobacco Use  . Smoking status: Never Smoker  . Smokeless tobacco: Never Used  Substance and Sexual Activity  . Alcohol use: No  . Drug use: No  . Sexual activity: Not on file  Lifestyle  . Physical activity:    Days per week: Not on file    Minutes per session: Not on file  . Stress: Not on file  Relationships  . Social connections:    Talks on phone: Not on file    Gets together: Not on file    Attends religious service: Not on file    Active member of club or organization: Not on file    Attends meetings of clubs or organizations: Not on file    Relationship status: Not on file  Other Topics Concern  . Not on file  Social History Narrative  . Not on file    Objective:  Physical Exam: BP 138/82 (BP Location: Left Arm, Patient Position: Sitting, Cuff Size: Normal)   Pulse 73   Temp 98.2 F (36.8 C) (Oral)   Ht 5\' 7"  (1.702 m)   Wt 169  lb 12.8 oz (77 kg)   SpO2 99%   BMI 26.59 kg/m   Body mass index is 26.59 kg/m. Wt Readings from Last 3 Encounters:  09/10/18 169 lb 12.8 oz (77 kg)  05/06/18 170 lb (77.1 kg)  07/08/17 171 lb (77.6 kg)   Gen: NAD, resting comfortably HEENT: TMs normal bilaterally. OP clear. No thyromegaly noted.  CV: RRR with no murmurs appreciated Pulm: NWOB, CTAB with no crackles, wheezes, or rhonchi GI: Normal bowel sounds present. Soft, Nontender, Nondistended. MSK: no edema, cyanosis, or clubbing noted Skin: warm, dry Neuro: CN2-12 grossly intact. Strength 5/5 in upper and lower extremities. Reflexes symmetric and intact bilaterally.  Psych: Normal affect and thought content  Assessment/Plan:  Dyslipidemia Check CBC, CMP, lipid panel, and TSH.  Hyperglycemia Check A1c.  Preventative Healthcare: Check PSA.  Patient  Counseling(The following topics were reviewed and/or handout was given):  -Nutrition: Stressed importance of moderation in sodium/caffeine intake, saturated fat and cholesterol, caloric balance, sufficient intake of fresh fruits, vegetables, and fiber.  -Stressed the importance of regular exercise.   -Substance Abuse: Discussed cessation/primary prevention of tobacco, alcohol, or other drug use; driving or other dangerous activities under the influence; availability of treatment for abuse.   -Injury prevention: Discussed safety belts, safety helmets, smoke detector, smoking near bedding or upholstery.   -Sexuality: Discussed sexually transmitted diseases, partner selection, use of condoms, avoidance of unintended pregnancy and contraceptive alternatives.   -Dental health: Discussed importance of regular tooth brushing, flossing, and dental visits.  -Health maintenance and immunizations reviewed. Please refer to Health maintenance section.  Return to care in 1 year for next preventative visit.   Katina Degreealeb M. Jimmey RalphParker, MD 09/10/2018 12:37 PM

## 2018-09-10 NOTE — Assessment & Plan Note (Signed)
Check CBC, CMP, lipid panel, and TSH.

## 2018-09-10 NOTE — Patient Instructions (Signed)
It was very nice to see you today!  Keep up the good work!  We will check blood work today.   Eat at least 3 REAL meals and 1-2 snacks per day.  Aim for no more than 5 hours between eating.  Eat breakfast within one hour of getting up.    Obtain twice as many fruits/vegetables as protein or carbohydrate foods for both lunch and dinner.   Cut down on sweet beverages. This includes juice, soda, and sweet tea.    Exercise at least 150 minutes every week.   Come back to see me in 1 year for your next physical, or sooner as needed.   Take care, Dr Jerline Pain   Preventive Care 40-64 Years, Male Preventive care refers to lifestyle choices and visits with your health care provider that can promote health and wellness. What does preventive care include?   A yearly physical exam. This is also called an annual well check.  Dental exams once or twice a year.  Routine eye exams. Ask your health care provider how often you should have your eyes checked.  Personal lifestyle choices, including: ? Daily care of your teeth and gums. ? Regular physical activity. ? Eating a healthy diet. ? Avoiding tobacco and drug use. ? Limiting alcohol use. ? Practicing safe sex. ? Taking low-dose aspirin every day starting at age 86. What happens during an annual well check? The services and screenings done by your health care provider during your annual well check will depend on your age, overall health, lifestyle risk factors, and family history of disease. Counseling Your health care provider may ask you questions about your:  Alcohol use.  Tobacco use.  Drug use.  Emotional well-being.  Home and relationship well-being.  Sexual activity.  Eating habits.  Work and work Statistician. Screening You may have the following tests or measurements:  Height, weight, and BMI.  Blood pressure.  Lipid and cholesterol levels. These may be checked every 5 years, or more frequently if you are  over 15 years old.  Skin check.  Lung cancer screening. You may have this screening every year starting at age 63 if you have a 30-pack-year history of smoking and currently smoke or have quit within the past 15 years.  Colorectal cancer screening. All adults should have this screening starting at age 78 and continuing until age 54. Your health care provider may recommend screening at age 42. You will have tests every 1-10 years, depending on your results and the type of screening test. People at increased risk should start screening at an earlier age. Screening tests may include: ? Guaiac-based fecal occult blood testing. ? Fecal immunochemical test (FIT). ? Stool DNA test. ? Virtual colonoscopy. ? Sigmoidoscopy. During this test, a flexible tube with a tiny camera (sigmoidoscope) is used to examine your rectum and lower colon. The sigmoidoscope is inserted through your anus into your rectum and lower colon. ? Colonoscopy. During this test, a long, thin, flexible tube with a tiny camera (colonoscope) is used to examine your entire colon and rectum.  Prostate cancer screening. Recommendations will vary depending on your family history and other risks.  Hepatitis C blood test.  Hepatitis B blood test.  Sexually transmitted disease (STD) testing.  Diabetes screening. This is done by checking your blood sugar (glucose) after you have not eaten for a while (fasting). You may have this done every 1-3 years. Discuss your test results, treatment options, and if necessary, the need for more tests  with your health care provider. Vaccines Your health care provider may recommend certain vaccines, such as:  Influenza vaccine. This is recommended every year.  Tetanus, diphtheria, and acellular pertussis (Tdap, Td) vaccine. You may need a Td booster every 10 years.  Varicella vaccine. You may need this if you have not been vaccinated.  Zoster vaccine. You may need this after age 62.  Measles,  mumps, and rubella (MMR) vaccine. You may need at least one dose of MMR if you were born in 1957 or later. You may also need a second dose.  Pneumococcal 13-valent conjugate (PCV13) vaccine. You may need this if you have certain conditions and have not been vaccinated.  Pneumococcal polysaccharide (PPSV23) vaccine. You may need one or two doses if you smoke cigarettes or if you have certain conditions.  Meningococcal vaccine. You may need this if you have certain conditions.  Hepatitis A vaccine. You may need this if you have certain conditions or if you travel or work in places where you may be exposed to hepatitis A.  Hepatitis B vaccine. You may need this if you have certain conditions or if you travel or work in places where you may be exposed to hepatitis B.  Haemophilus influenzae type b (Hib) vaccine. You may need this if you have certain risk factors. Talk to your health care provider about which screenings and vaccines you need and how often you need them. This information is not intended to replace advice given to you by your health care provider. Make sure you discuss any questions you have with your health care provider. Document Released: 09/15/2015 Document Revised: 10/09/2017 Document Reviewed: 06/20/2015 Elsevier Interactive Patient Education  2019 [employer].

## 2018-09-11 ENCOUNTER — Encounter: Payer: Self-pay | Admitting: Family Medicine

## 2018-09-11 DIAGNOSIS — R7303 Prediabetes: Secondary | ICD-10-CM | POA: Insufficient documentation

## 2018-09-11 NOTE — Progress Notes (Signed)
Please inform patient of the following:  His cholesterol and blood sugar levels are borderline. Recommend starting lipitor 40mg  daily to improve cholesterol and reduce risk of heart attack and stroke.   Also recommend starting metformin 750mg  daily to improve blood sugar and reduce risk of heart attack and stroke. Regardless he should continue working on diet and exercise.  We can recheck in 6-12 months.  All of his other labs are normal.  Katina Degree. Jimmey Ralph, MD 09/11/2018 12:45 PM

## 2018-10-26 ENCOUNTER — Encounter: Payer: Self-pay | Admitting: Family Medicine

## 2018-10-26 ENCOUNTER — Ambulatory Visit (INDEPENDENT_AMBULATORY_CARE_PROVIDER_SITE_OTHER): Payer: 59 | Admitting: Family Medicine

## 2018-10-26 ENCOUNTER — Ambulatory Visit (INDEPENDENT_AMBULATORY_CARE_PROVIDER_SITE_OTHER): Payer: 59

## 2018-10-26 VITALS — BP 132/76 | HR 71 | Temp 98.3°F | Ht 67.0 in | Wt 169.0 lb

## 2018-10-26 DIAGNOSIS — K5900 Constipation, unspecified: Secondary | ICD-10-CM

## 2018-10-26 DIAGNOSIS — R109 Unspecified abdominal pain: Secondary | ICD-10-CM

## 2018-10-26 NOTE — Progress Notes (Signed)
   Chief Complaint:  Derek Wong is a 58 y.o. male who presents for same day appointment with a chief complaint of abdominal pain.   Assessment/Plan:  Abdominal pain/constipation No red flags.  Benign exam.  KUB with significant stool burden based on my read-we will await radiology read.  Will start bowel regimen with MiraLAX as needed to have 1 soft bowel movement daily.  Also recommended good oral hydration and fiber supplementation as needed.  Recommended use of senna if no bowel movement in the next 1 to 2 days.  Discussed reasons return to care and seek emergent care.  Follow-up as needed.  If symptoms persist despite treatment of his constipation, would consider further work-up including CMET, CBC, lipase, H. pylori, and possible CT scan.    Subjective:  HPI:  Abdominal Pain, acute problem Started 2 days ago. Located "all over" abdomen. He has not had a bowel movement for 2 days. No treatments tried. No fevers or chills. No nausea or vomiting. Has not had bowel movement for 2 days. No diarrhea or hematochezia. No weight loss. Some decreased appetite. No other obvious alleviating or aggravating factors.   ROS: Per HPI  PMH: He reports that he has never smoked. He has never used smokeless tobacco. He reports that he does not drink alcohol or use drugs.      Objective:  Physical Exam: BP 132/76 (BP Location: Left Arm, Patient Position: Sitting, Cuff Size: Normal)   Pulse 71   Temp 98.3 F (36.8 C) (Oral)   Ht 5\' 7"  (1.702 m)   Wt 169 lb (76.7 kg)   SpO2 98%   BMI 26.47 kg/m   Wt Readings from Last 3 Encounters:  10/26/18 169 lb (76.7 kg)  09/10/18 169 lb 12.8 oz (77 kg)  05/06/18 170 lb (77.1 kg)  Gen: NAD, resting comfortably CV: Regular rate and rhythm with no murmurs appreciated Pulm: Normal work of breathing, clear to auscultation bilaterally with no crackles, wheezes, or rhonchi GI: Normal bowel sounds present. Soft, Nontender, Nondistended. No rebound or  guarding.      Time Spent: I spent >25 minutes face-to-face with the patient, with more than half spent on counseling for management plan for his abdominal pain and constipation.  Katina Degree. Jimmey Ralph, MD 10/26/2018 11:19 AM

## 2018-10-26 NOTE — Progress Notes (Signed)
Please inform patient of the following:  Radiology reviewed his xray and saw constipation with no other abnormalities.  Katina Degree. Jimmey Ralph, MD 10/26/2018 4:51 PM

## 2018-10-26 NOTE — Patient Instructions (Signed)
It was very nice to see you today!  You are constipated.  This is causing your symptoms.  Please make sure that you are getting plenty of fluid.  Please start a fiber supplement.  I would like for you to take MiraLAX as needed to have 1 soft bowel movement daily.  Start with 1 packet/day or 1 capful per day and then adjust as needed.   If you do not have a bowel movement 1 to 2 days, please use senna.  Please let me know if your symptoms worsen or do not improve the next 5 to 7 days.  Take care, Dr Jimmey Ralph   Constipation, Adult Constipation is when a person:  Poops (has a bowel movement) fewer times in a week than normal.  Has a hard time pooping.  Has poop that is dry, hard, or bigger than normal. Follow these instructions at home: Eating and drinking   Eat foods that have a lot of fiber, such as: ? Fresh fruits and vegetables. ? Whole grains. ? Beans.  Eat less of foods that are high in fat, low in fiber, or overly processed, such as: ? [place] fries. ? Hamburgers. ? Cookies. ? Candy. ? Soda.  Drink enough fluid to keep your pee (urine) clear or pale yellow. General instructions  Exercise regularly or as told by your doctor.  Go to the restroom when you feel like you need to poop. Do not hold it in.  Take over-the-counter and prescription medicines only as told by your doctor. These include any fiber supplements.  Do pelvic floor retraining exercises, such as: ? Doing deep breathing while relaxing your lower belly (abdomen). ? Relaxing your pelvic floor while pooping.  Watch your condition for any changes.  Keep all follow-up visits as told by your doctor. This is important. Contact a doctor if:  You have pain that gets worse.  You have a fever.  You have not pooped for 4 days.  You throw up (vomit).  You are not hungry.  You lose weight.  You are bleeding from the anus.  You have thin, pencil-like poop (stool). Get help right away if:  You have  a fever, and your symptoms suddenly get worse.  You leak poop or have blood in your poop.  Your belly feels hard or bigger than normal (is bloated).  You have very bad belly pain.  You feel dizzy or you faint. This information is not intended to replace advice given to you by your health care provider. Make sure you discuss any questions you have with your health care provider. Document Released: 02/05/2008 Document Revised: 03/08/2016 Document Reviewed: 02/07/2016 Elsevier Interactive Patient Education  2019 [employer].

## 2019-10-28 ENCOUNTER — Other Ambulatory Visit: Payer: Self-pay

## 2019-10-29 ENCOUNTER — Encounter: Payer: Self-pay | Admitting: Family Medicine

## 2019-10-29 ENCOUNTER — Ambulatory Visit (INDEPENDENT_AMBULATORY_CARE_PROVIDER_SITE_OTHER): Payer: 59 | Admitting: Family Medicine

## 2019-10-29 VITALS — BP 142/94 | HR 65 | Temp 97.0°F | Ht 67.0 in | Wt 171.5 lb

## 2019-10-29 DIAGNOSIS — E663 Overweight: Secondary | ICD-10-CM

## 2019-10-29 DIAGNOSIS — Z0001 Encounter for general adult medical examination with abnormal findings: Secondary | ICD-10-CM | POA: Diagnosis not present

## 2019-10-29 DIAGNOSIS — R7303 Prediabetes: Secondary | ICD-10-CM | POA: Diagnosis not present

## 2019-10-29 DIAGNOSIS — E785 Hyperlipidemia, unspecified: Secondary | ICD-10-CM

## 2019-10-29 DIAGNOSIS — Z125 Encounter for screening for malignant neoplasm of prostate: Secondary | ICD-10-CM

## 2019-10-29 DIAGNOSIS — Z6826 Body mass index (BMI) 26.0-26.9, adult: Secondary | ICD-10-CM

## 2019-10-29 LAB — COMPREHENSIVE METABOLIC PANEL
ALT: 14 U/L (ref 0–53)
AST: 17 U/L (ref 0–37)
Albumin: 4.3 g/dL (ref 3.5–5.2)
Alkaline Phosphatase: 60 U/L (ref 39–117)
BUN: 14 mg/dL (ref 6–23)
CO2: 29 mEq/L (ref 19–32)
Calcium: 9.5 mg/dL (ref 8.4–10.5)
Chloride: 106 mEq/L (ref 96–112)
Creatinine, Ser: 1.31 mg/dL (ref 0.40–1.50)
GFR: 67.44 mL/min (ref >60.00)
Glucose, Bld: 106 mg/dL — ABNORMAL HIGH (ref 70–99)
Potassium: 4.9 mEq/L (ref 3.5–5.1)
Sodium: 140 mEq/L (ref 135–145)
Total Bilirubin: 0.6 mg/dL (ref 0.2–1.2)
Total Protein: 7.1 g/dL (ref 6.0–8.3)

## 2019-10-29 LAB — CBC
HCT: 41.4 % (ref 39.0–52.0)
Hemoglobin: 14.2 g/dL (ref 13.0–17.0)
MCHC: 34.3 g/dL (ref 30.0–36.0)
MCV: 86.4 fl (ref 78.0–100.0)
Platelets: 182 10*3/uL (ref 150.0–400.0)
RBC: 4.79 Mil/uL (ref 4.22–5.81)
RDW: 13 % (ref 11.5–15.5)
WBC: 4.7 10*3/uL (ref 4.0–10.5)

## 2019-10-29 LAB — LIPID PANEL
Cholesterol: 225 mg/dL — ABNORMAL HIGH (ref 0–200)
HDL: 37 mg/dL — ABNORMAL LOW (ref >39.00)
LDL Cholesterol: 172 mg/dL — ABNORMAL HIGH (ref 0–99)
NonHDL: 187.63
Total CHOL/HDL Ratio: 6
Triglycerides: 79 mg/dL (ref 0.0–149.0)
VLDL: 15.8 mg/dL (ref 0.0–40.0)

## 2019-10-29 LAB — PSA: PSA: 0.41 ng/mL (ref 0.10–4.00)

## 2019-10-29 LAB — HEMOGLOBIN A1C: Hgb A1c MFr Bld: 5.5 % (ref 4.6–6.5)

## 2019-10-29 LAB — TSH: TSH: 1.72 u[IU]/mL (ref 0.35–4.50)

## 2019-10-29 NOTE — Assessment & Plan Note (Signed)
Check CBC, C met, TSH, lipid panel. 

## 2019-10-29 NOTE — Patient Instructions (Signed)
It was very nice to see you today!  We will check blood work today.  Please give you begin work with your diet and exercise.  Come back in 1 year for your next physical, or sooner if needed.  Take care, Dr Jerline Pain  Please try these tips to maintain a healthy lifestyle:   Eat at least 3 REAL meals and 1-2 snacks per day.  Aim for no more than 5 hours between eating.  If you eat breakfast, please do so within one hour of getting up.    Each meal should contain half fruits/vegetables, one quarter protein, and one quarter carbs (no bigger than a computer mouse)   Cut down on sweet beverages. This includes juice, soda, and sweet tea.     Drink at least 1 glass of water with each meal and aim for at least 8 glasses per day   Exercise at least 150 minutes every week.    Preventive Care 40-106 Years Old, Male Preventive care refers to lifestyle choices and visits with your health care provider that can promote health and wellness. This includes:  A yearly physical exam. This is also called an annual well check.  Regular dental and eye exams.  Immunizations.  Screening for certain conditions.  Healthy lifestyle choices, such as eating a healthy diet, getting regular exercise, not using drugs or products that contain nicotine and tobacco, and limiting alcohol use. What can I expect for my preventive care visit? Physical exam Your health care provider will check:  Height and weight. These may be used to calculate body mass index (BMI), which is a measurement that tells if you are at a healthy weight.  Heart rate and blood pressure.  Your skin for abnormal spots. Counseling Your health care provider may ask you questions about:  Alcohol, tobacco, and drug use.  Emotional well-being.  Home and relationship well-being.  Sexual activity.  Eating habits.  Work and work Statistician. What immunizations do I need?  Influenza (flu) vaccine  This is recommended every  year. Tetanus, diphtheria, and pertussis (Tdap) vaccine  You may need a Td booster every 10 years. Varicella (chickenpox) vaccine  You may need this vaccine if you have not already been vaccinated. Zoster (shingles) vaccine  You may need this after age 45. Measles, mumps, and rubella (MMR) vaccine  You may need at least one dose of MMR if you were born in 1957 or later. You may also need a second dose. Pneumococcal conjugate (PCV13) vaccine  You may need this if you have certain conditions and were not previously vaccinated. Pneumococcal polysaccharide (PPSV23) vaccine  You may need one or two doses if you smoke cigarettes or if you have certain conditions. Meningococcal conjugate (MenACWY) vaccine  You may need this if you have certain conditions. Hepatitis A vaccine  You may need this if you have certain conditions or if you travel or work in places where you may be exposed to hepatitis A. Hepatitis B vaccine  You may need this if you have certain conditions or if you travel or work in places where you may be exposed to hepatitis B. Haemophilus influenzae type b (Hib) vaccine  You may need this if you have certain risk factors. Human papillomavirus (HPV) vaccine  If recommended by your health care provider, you may need three doses over 6 months. You may receive vaccines as individual doses or as more than one vaccine together in one shot (combination vaccines). Talk with your health care provider about  the risks and benefits of combination vaccines. What tests do I need? Blood tests  Lipid and cholesterol levels. These may be checked every 5 years, or more frequently if you are over 21 years old.  Hepatitis C test.  Hepatitis B test. Screening  Lung cancer screening. You may have this screening every year starting at age 92 if you have a 30-pack-year history of smoking and currently smoke or have quit within the past 15 years.  Prostate cancer screening.  Recommendations will vary depending on your family history and other risks.  Colorectal cancer screening. All adults should have this screening starting at age 68 and continuing until age 94. Your health care provider may recommend screening at age 50 if you are at increased risk. You will have tests every 1-10 years, depending on your results and the type of screening test.  Diabetes screening. This is done by checking your blood sugar (glucose) after you have not eaten for a while (fasting). You may have this done every 1-3 years.  Sexually transmitted disease (STD) testing. Follow these instructions at home: Eating and drinking  Eat a diet that includes fresh fruits and vegetables, whole grains, lean protein, and low-fat dairy products.  Take vitamin and mineral supplements as recommended by your health care provider.  Do not drink alcohol if your health care provider tells you not to drink.  If you drink alcohol: ? Limit how much you have to 0-2 drinks a day. ? Be aware of how much alcohol is in your drink. In the [place] one drink equals one 12 oz bottle of beer (355 mL), one 5 oz glass of wine (148 mL), or one 1 oz glass of hard liquor (44 mL). Lifestyle  Take daily care of your teeth and gums.  Stay active. Exercise for at least 30 minutes on 5 or more days each week.  Do not use any products that contain nicotine or tobacco, such as cigarettes, e-cigarettes, and chewing tobacco. If you need help quitting, ask your health care provider.  If you are sexually active, practice safe sex. Use a condom or other form of protection to prevent STIs (sexually transmitted infections).  Talk with your health care provider about taking a low-dose aspirin every day starting at age 61. What's next?  Go to your health care provider once a year for a well check visit.  Ask your health care provider how often you should have your eyes and teeth checked.  Stay up to date on all vaccines. This  information is not intended to replace advice given to you by your health care provider. Make sure you discuss any questions you have with your health care provider. Document Revised: 08/13/2018 Document Reviewed: 08/13/2018 Elsevier Patient Education  2020 [employer].

## 2019-10-29 NOTE — Progress Notes (Signed)
Chief Complaint:  Derek Wong is a 59 y.o. male who presents today for his annual comprehensive physical exam.    Assessment/Plan:  New/Acute Problems: Elevated BP Reading Slightly above goal today.  Previously well controlled.  Discussed importance of regular exercise and low-sodium diet.  Continue home monitoring goal 140/90 or lower.  Chronic Problems Addressed Today: Dyslipidemia Check CBC, C met, TSH, lipid panel.  Prediabetes Check A1c.  Body mass index is 26.86 kg/m. / Overweight BMI Metric Follow Up - 10/29/19 0842      BMI Metric Follow Up-Please document annually   BMI Metric Follow Up  Education provided        Preventative Healthcare: Up-to-date on colon cancer screening.  Check CBC, C met, TSH, PSA.  Patient Counseling(The following topics were reviewed and/or handout was given):  -Nutrition: Stressed importance of moderation in sodium/caffeine intake, saturated fat and cholesterol, caloric balance, sufficient intake of fresh fruits, vegetables, and fiber.  -Stressed the importance of regular exercise.   -Substance Abuse: Discussed cessation/primary prevention of tobacco, alcohol, or other drug use; driving or other dangerous activities under the influence; availability of treatment for abuse.   -Injury prevention: Discussed safety belts, safety helmets, smoke detector, smoking near bedding or upholstery.   -Sexuality: Discussed sexually transmitted diseases, partner selection, use of condoms, avoidance of unintended pregnancy and contraceptive alternatives.   -Dental health: Discussed importance of regular tooth brushing, flossing, and dental visits.  -Health maintenance and immunizations reviewed. Please refer to Health maintenance section.  Return to care in 1 year for next preventative visit.     Subjective:  HPI:  He has no acute complaints today.   Lifestyle Diet: Balanced. Plenty of fruits and vegetables.  Exercise: Exercises at Home. Lots  of cardio.   Depression screen PHQ 2/9 10/29/2019  Decreased Interest 0  Down, Depressed, Hopeless 0  PHQ - 2 Score 0    Health Maintenance Due  Topic Date Due  . HIV Screening  05[birth year]1974     ROS: Per HPI, otherwise a complete review of systems was negative.   PMH:  The following were reviewed and entered/updated in epic: Past Medical History:  Diagnosis Date  . BACK PAIN 10/27/2008  . BACK PAIN 10/27/2008   Qualifier: Diagnosis of  By: Burnice Logan  MD, Doretha Sou   . Elevated transaminase level 02/08/2011  . Gynecomastia, male 01/10/2016  . GYNECOMASTIA, UNILATERAL 07/07/2008  . Head injury, closed   . HYPERLIPIDEMIA 10/23/2007  . Hypogonadism in male 10/02/2015  . OTALGIA 09/21/2007  . 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 PVCs (premature ventricular contractions) 05/06/2018  . Testosterone deficiency    Patient Active Problem List   Diagnosis Date Noted  . Prediabetes 09/11/2018  . 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 Dyslipidemia 10/23/2007   Past Surgical History:  Procedure Laterality Date  . NO PAST SURGERIES      Family History  Problem Relation Age of Onset  . Diabetes Father   . Hypercholesterolemia Father   . Colon cancer Neg Hx   . Other Neg Hx        hypogonadism    Medications- reviewed and updated No current outpatient medications on file.   No current facility-administered medications for this visit.    Allergies-reviewed and updated No Known Allergies  Social History   Socioeconomic History  . Marital status: Married    Spouse name: Not on file  . Number of children: Not on file  .  Years of education: Not on file  . Highest education level: Not on file  Occupational History  . Not on file  Tobacco Use  . Smoking status: Never Smoker  . Smokeless tobacco: Never Used  Substance and Sexual Activity  . Alcohol use: No  . Drug use: No  . Sexual activity: Not on file  Other Topics Concern  . Not on  file  Social History Narrative  . Not on file   Social Determinants of Health   Financial Resource Strain:   . Difficulty of Paying Living Expenses: Not on file  Food Insecurity:   . Worried About Charity fundraiser in the Last Year: Not on file  . Ran Out of Food in the Last Year: Not on file  Transportation Needs:   . Lack of Transportation (Medical): Not on file  . Lack of Transportation (Non-Medical): Not on file  Physical Activity:   . Days of Exercise per Week: Not on file  . Minutes of Exercise per Session: Not on file  Stress:   . Feeling of Stress : Not on file  Social Connections:   . Frequency of Communication with Friends and Family: Not on file  . Frequency of Social Gatherings with Friends and Family: Not on file  . Attends Religious Services: Not on file  . Active Member of Clubs or Organizations: Not on file  . Attends Archivist Meetings: Not on file  . Marital Status: Not on file        Objective:  Physical Exam: BP (!) 142/94   Pulse 65   Temp (!) 97 F (36.1 C)   Ht 5' 7" (1.702 m)   Wt 171 lb 8 oz (77.8 kg)   SpO2 95%   BMI 26.86 kg/m   Body mass index is 26.86 kg/m. Wt Readings from Last 3 Encounters:  10/29/19 171 lb 8 oz (77.8 kg)  10/26/18 169 lb (76.7 kg)  09/10/18 169 lb 12.8 oz (77 kg)   Gen: NAD, resting comfortably HEENT: TMs normal bilaterally. OP clear. No thyromegaly noted.  CV: RRR with no murmurs appreciated Pulm: NWOB, CTAB with no crackles, wheezes, or rhonchi GI: Normal bowel sounds present. Soft, Nontender, Nondistended. MSK: no edema, cyanosis, or clubbing noted Skin: warm, dry Neuro: CN2-12 grossly intact. Strength 5/5 in upper and lower extremities. Reflexes symmetric and intact bilaterally.  Psych: Normal affect and thought content     Gahel Safley M. Jerline Pain, MD 10/29/2019 8:42 AM

## 2019-10-29 NOTE — Assessment & Plan Note (Signed)
Check A1c. 

## 2019-11-01 NOTE — Progress Notes (Signed)
Please inform patient of the following:  Cholesterol is higher than last year. Recommend starting lipitor 40mg  daily to improve numbers and lower risk of heart attack and stroke. We can recheck in a year or so. All of his other labs are stable.  . Katina Degree, MD 11/01/2019 4:00 PM

## 2020-08-18 ENCOUNTER — Other Ambulatory Visit: Payer: Self-pay | Admitting: *Deleted

## 2020-08-18 ENCOUNTER — Ambulatory Visit (INDEPENDENT_AMBULATORY_CARE_PROVIDER_SITE_OTHER): Payer: 59 | Admitting: Family Medicine

## 2020-08-18 ENCOUNTER — Encounter: Payer: Self-pay | Admitting: Family Medicine

## 2020-08-18 ENCOUNTER — Other Ambulatory Visit: Payer: Self-pay

## 2020-08-18 VITALS — BP 166/74 | HR 69 | Temp 97.8°F | Ht 67.0 in | Wt 169.8 lb

## 2020-08-18 DIAGNOSIS — R42 Dizziness and giddiness: Secondary | ICD-10-CM | POA: Diagnosis not present

## 2020-08-18 DIAGNOSIS — R011 Cardiac murmur, unspecified: Secondary | ICD-10-CM

## 2020-08-18 NOTE — Patient Instructions (Signed)
It was very nice to see you today!  We will check blood work and a urine sample.  I have concern he may have a leaky valve in your heart.  We will check an ultrasound to take a closer look at this.  In the meantime please make sure that you are getting plenty of fluids and staying well-hydrated.  Please let us know if your symptoms worsen.  Take care, Dr Jimmey Ralph  Please try these tips to maintain a healthy lifestyle:   Eat at least 3 REAL meals and 1-2 snacks per day.  Aim for no more than 5 hours between eating.  If you eat breakfast, please do so within one hour of getting up.    Each meal should contain half fruits/vegetables, one quarter protein, and one quarter carbs (no bigger than a computer mouse)   Cut down on sweet beverages. This includes juice, soda, and sweet tea.     Drink at least 1 glass of water with each meal and aim for at least 8 glasses per day   Exercise at least 150 minutes every week.

## 2020-08-18 NOTE — Progress Notes (Signed)
   Derek Wong is a 59 y.o. male who presents today for an office visit.  Assessment/Plan:  New/Acute Problems: Dizziness Normal orthostatic vital signs though symptoms seem to be provoked upon standing.  No vertigo or room spinning sensations consistent with BPPV.  Reassuring that he has not had any symptoms at rest.  Neuro exam is normal. He does have a soft systolic murmur -may have aortic stenosis or other similar allergy which could be contributing.  Will check labs including CBC, CMET, TSH, and urinalysis.  Also check echocardiogram.  Encourage good oral hydration.  Discussed reasons to return to care and seek emergent care.     Subjective:  HPI:  Patient here with dizziness for the last couple of weeks.  Does not have any symptoms at rest though notices whenever he stands up he feels off balance.  No arm spinning sensation.  No weakness or numbness.  No nausea or vomiting.  Symptoms last for a few moments and then subside.  He thinks he has been staying well-hydrated.  Has normal appetite.  Symptoms normally occur when standing or moving.  No symptoms at rest.  No reported chest pain or shortness of breath.        Objective:  Physical Exam: BP (!) 166/74   Pulse 69   Temp 97.8 F (36.6 C) (Temporal)   Ht 5\' 7"  (1.702 m)   Wt 169 lb 12.8 oz (77 kg)   SpO2 100%   BMI 26.59 kg/m   Wt Readings from Last 3 Encounters:  08/18/20 169 lb 12.8 oz (77 kg)  10/29/19 171 lb 8 oz (77.8 kg)  10/26/18 169 lb (76.7 kg)  Gen: No acute distress, resting comfortably CV: Regular rate and rhythm with 2/6 systolic murmur appreciated Pulm: Normal work of breathing, clear to auscultation bilaterally with no crackles, wheezes, or rhonchi Neuro: CN 2-12 intact. Moves all extremities. Coordination intact bilaterally.  Psych: Normal affect and thought content      Kaleigh Spiegelman M. 10/28/18, MD 08/18/2020 10:58 AM

## 2020-08-19 LAB — CBC
HCT: 39.5 % (ref 38.5–50.0)
Hemoglobin: 13.4 g/dL (ref 13.2–17.1)
MCH: 29.2 pg (ref 27.0–33.0)
MCHC: 33.9 g/dL (ref 32.0–36.0)
MCV: 86.1 fL (ref 80.0–100.0)
MPV: 11 fL (ref 7.5–12.5)
Platelets: 194 10*3/uL (ref 140–400)
RBC: 4.59 10*6/uL (ref 4.20–5.80)
RDW: 12.9 % (ref 11.0–15.0)
WBC: 4.3 10*3/uL (ref 3.8–10.8)

## 2020-08-19 LAB — COMPREHENSIVE METABOLIC PANEL
AG Ratio: 1.6 (calc) (ref 1.0–2.5)
ALT: 19 U/L (ref 9–46)
AST: 20 U/L (ref 10–35)
Albumin: 4.2 g/dL (ref 3.6–5.1)
Alkaline phosphatase (APISO): 61 U/L (ref 35–144)
BUN/Creatinine Ratio: 9 (calc) (ref 6–22)
BUN: 12 mg/dL (ref 7–25)
CO2: 30 mmol/L (ref 20–32)
Calcium: 9.1 mg/dL (ref 8.6–10.3)
Chloride: 106 mmol/L (ref 98–110)
Creat: 1.27 mg/dL — ABNORMAL HIGH (ref 0.70–1.25)
Globulin: 2.6 g/dL (calc) (ref 1.9–3.7)
Glucose, Bld: 89 mg/dL (ref 65–99)
Potassium: 4.3 mmol/L (ref 3.5–5.3)
Sodium: 142 mmol/L (ref 135–146)
Total Bilirubin: 0.6 mg/dL (ref 0.2–1.2)
Total Protein: 6.8 g/dL (ref 6.1–8.1)

## 2020-08-19 LAB — URINALYSIS, ROUTINE W REFLEX MICROSCOPIC
Bilirubin Urine: NEGATIVE
Glucose, UA: NEGATIVE
Hgb urine dipstick: NEGATIVE
Ketones, ur: NEGATIVE
Leukocytes,Ua: NEGATIVE
Nitrite: NEGATIVE
Protein, ur: NEGATIVE
Specific Gravity, Urine: 1.018 (ref 1.001–1.03)
pH: 8 (ref 5.0–8.0)

## 2020-08-19 LAB — TSH: TSH: 1.88 mIU/L (ref 0.40–4.50)

## 2020-08-21 NOTE — Progress Notes (Signed)
Please inform patient of the following:  Blood work is all NORMAL. We will contact him with results of his echocardiogram once they are available.  Katina Degree. Jimmey Ralph, MD 08/21/2020 10:31 AM

## 2020-10-17 ENCOUNTER — Other Ambulatory Visit: Payer: Self-pay

## 2020-10-17 ENCOUNTER — Ambulatory Visit (HOSPITAL_COMMUNITY): Payer: 59 | Attending: Cardiovascular Disease

## 2020-10-17 DIAGNOSIS — R42 Dizziness and giddiness: Secondary | ICD-10-CM | POA: Diagnosis not present

## 2020-10-17 DIAGNOSIS — R011 Cardiac murmur, unspecified: Secondary | ICD-10-CM | POA: Insufficient documentation

## 2020-10-17 LAB — ECHOCARDIOGRAM COMPLETE
Area-P 1/2: 3.27 cm2
S' Lateral: 2.9 cm

## 2020-10-18 NOTE — Progress Notes (Signed)
Please inform patient of the following:  His echocardiogram does not have any obvious explanation for his dizziness. Would like for him to let us know if he is still having symptoms and we can refer him to see a cardiologist.  Derek Wong. Jimmey Ralph, MD 10/18/2020 8:16 AM

## 2020-11-07 ENCOUNTER — Encounter: Payer: Self-pay | Admitting: Family Medicine

## 2020-11-07 ENCOUNTER — Ambulatory Visit (INDEPENDENT_AMBULATORY_CARE_PROVIDER_SITE_OTHER): Payer: 59 | Admitting: Family Medicine

## 2020-11-07 ENCOUNTER — Other Ambulatory Visit: Payer: Self-pay

## 2020-11-07 VITALS — BP 159/89 | HR 67 | Temp 98.6°F | Ht 67.0 in | Wt 172.2 lb

## 2020-11-07 DIAGNOSIS — R7303 Prediabetes: Secondary | ICD-10-CM | POA: Diagnosis not present

## 2020-11-07 DIAGNOSIS — E785 Hyperlipidemia, unspecified: Secondary | ICD-10-CM

## 2020-11-07 DIAGNOSIS — Z125 Encounter for screening for malignant neoplasm of prostate: Secondary | ICD-10-CM | POA: Diagnosis not present

## 2020-11-07 DIAGNOSIS — Z0001 Encounter for general adult medical examination with abnormal findings: Secondary | ICD-10-CM | POA: Diagnosis not present

## 2020-11-07 LAB — LIPID PANEL
Cholesterol: 233 mg/dL — ABNORMAL HIGH (ref 0–200)
HDL: 41.4 mg/dL (ref >39.00)
LDL Cholesterol: 167 mg/dL — ABNORMAL HIGH (ref 0–99)
NonHDL: 191.58
Total CHOL/HDL Ratio: 6
Triglycerides: 123 mg/dL (ref 0.0–149.0)
VLDL: 24.6 mg/dL (ref 0.0–40.0)

## 2020-11-07 LAB — HEMOGLOBIN A1C: Hgb A1c MFr Bld: 5.5 % (ref 4.6–6.5)

## 2020-11-07 LAB — PSA: PSA: 0.54 ng/mL (ref 0.10–4.00)

## 2020-11-07 NOTE — Assessment & Plan Note (Signed)
Check A1c. 

## 2020-11-07 NOTE — Patient Instructions (Signed)
It was very nice to see you today!  Please keep an eye on your blood pressure over the next several weeks and see me a message to let me know how they are looking.  Ideally I would like for your numbers to be less than 140/90.  We will check blood work today.  I will see you back in year for your next physical.  Please come back to see me sooner if needed.  Take care, Dr Jimmey Ralph  Please try these tips to maintain a healthy lifestyle:   Eat at least 3 REAL meals and 1-2 snacks per day.  Aim for no more than 5 hours between eating.  If you eat breakfast, please do so within one hour of getting up.    Each meal should contain half fruits/vegetables, one quarter protein, and one quarter carbs (no bigger than a computer mouse)   Cut down on sweet beverages. This includes juice, soda, and sweet tea.     Drink at least 1 glass of water with each meal and aim for at least 8 glasses per day   Exercise at least 150 minutes every week.    Preventive Care 58-61 Years Old, Male Preventive care refers to lifestyle choices and visits with your health care provider that can promote health and wellness. This includes:  A yearly physical exam. This is also called an annual wellness visit.  Regular dental and eye exams.  Immunizations.  Screening for certain conditions.  Healthy lifestyle choices, such as: ? Eating a healthy diet. ? Getting regular exercise. ? Not using drugs or products that contain nicotine and tobacco. ? Limiting alcohol use. What can I expect for my preventive care visit? Physical exam Your health care provider will check your:  Height and weight. These may be used to calculate your BMI (body mass index). BMI is a measurement that tells if you are at a healthy weight.  Heart rate and blood pressure.  Body temperature.  Skin for abnormal spots. Counseling Your health care provider may ask you questions about your:  Past medical problems.  Family's medical  history.  Alcohol, tobacco, and drug use.  Emotional well-being.  Home life and relationship well-being.  Sexual activity.  Diet, exercise, and sleep habits.  Work and work Astronomer.  Access to firearms. What immunizations do I need? Vaccines are usually given at various ages, according to a schedule. Your health care provider will recommend vaccines for you based on your age, medical history, and lifestyle or other factors, such as travel or where you work.   What tests do I need? Blood tests  Lipid and cholesterol levels. These may be checked every 5 years, or more often if you are over 58 years old.  Hepatitis C test.  Hepatitis B test. Screening  Lung cancer screening. You may have this screening every year starting at age 18 if you have a 30-pack-year history of smoking and currently smoke or have quit within the past 15 years.  Prostate cancer screening. Recommendations will vary depending on your family history and other risks.  Genital exam to check for testicular cancer or hernias.  Colorectal cancer screening. ? All adults should have this screening starting at age 82 and continuing until age 11. ? Your health care provider may recommend screening at age 51 if you are at increased risk. ? You will have tests every 1-10 years, depending on your results and the type of screening test.  Diabetes screening. ? This is done  by checking your blood sugar (glucose) after you have not eaten for a while (fasting). ? You may have this done every 1-3 years.  STD (sexually transmitted disease) testing, if you are at risk. Follow these instructions at home: Eating and drinking  Eat a diet that includes fresh fruits and vegetables, whole grains, lean protein, and low-fat dairy products.  Take vitamin and mineral supplements as recommended by your health care provider.  Do not drink alcohol if your health care provider tells you not to drink.  If you drink  alcohol: ? Limit how much you have to 0-2 drinks a day. ? Be aware of how much alcohol is in your drink. In the [place] one drink equals one 12 oz bottle of beer (355 mL), one 5 oz glass of wine (148 mL), or one 1 oz glass of hard liquor (44 mL).   Lifestyle  Take daily care of your teeth and gums. Brush your teeth every morning and night with fluoride toothpaste. Floss one time each day.  Stay active. Exercise for at least 30 minutes 5 or more days each week.  Do not use any products that contain nicotine or tobacco, such as cigarettes, e-cigarettes, and chewing tobacco. If you need help quitting, ask your health care provider.  Do not use drugs.  If you are sexually active, practice safe sex. Use a condom or other form of protection to prevent STIs (sexually transmitted infections).  If told by your health care provider, take low-dose aspirin daily starting at age 63.  Find healthy ways to cope with stress, such as: ? Meditation, yoga, or listening to music. ? Journaling. ? Talking to a trusted person. ? Spending time with friends and family. Safety  Always wear your seat belt while driving or riding in a vehicle.  Do not drive: ? If you have been drinking alcohol. Do not ride with someone who has been drinking. ? When you are tired or distracted. ? While texting.  Wear a helmet and other protective equipment during sports activities.  If you have firearms in your house, make sure you follow all gun safety procedures. What's next?  Go to your health care provider once a year for an annual wellness visit.  Ask your health care provider how often you should have your eyes and teeth checked.  Stay up to date on all vaccines. This information is not intended to replace advice given to you by your health care provider. Make sure you discuss any questions you have with your health care provider. Document Revised: 05/18/2019 Document Reviewed: 08/13/2018 Elsevier Patient  Education  2021 [employer].

## 2020-11-07 NOTE — Progress Notes (Signed)
Chief Complaint:  Derek Wong is a 60 y.o. male who presents today for his annual comprehensive physical exam.    Assessment/Plan:  New/Acute Problems: Elevated BP Reading Above goal today at 158/89.  Has previously been well controlled.  He does not check at home.  Given his previous good control we will continue with home monitoring for the next several weeks and he will check in with me via MyChart.  Chronic Problems Addressed Today: Dyslipidemia Check lipids.  Discussed lifestyle modifications.   Prediabetes Check A1c.   Body mass index is 26.97 kg/m. / Overweight  BMI Metric Follow Up - 11/07/20 1353      BMI Metric Follow Up-Please document annually   BMI Metric Follow Up Education provided            Preventative Healthcare: Check labs today.  Up-to-date on vaccines and screenings.  Patient Counseling(The following topics were reviewed and/or handout was given):  -Nutrition: Stressed importance of moderation in sodium/caffeine intake, saturated fat and cholesterol, caloric balance, sufficient intake of fresh fruits, vegetables, and fiber.  -Stressed the importance of regular exercise.   -Substance Abuse: Discussed cessation/primary prevention of tobacco, alcohol, or other drug use; driving or other dangerous activities under the influence; availability of treatment for abuse.   -Injury prevention: Discussed safety belts, safety helmets, smoke detector, smoking near bedding or upholstery.   -Sexuality: Discussed sexually transmitted diseases, partner selection, use of condoms, avoidance of unintended pregnancy and contraceptive alternatives.   -Dental health: Discussed importance of regular tooth brushing, flossing, and dental visits.  -Health maintenance and immunizations reviewed. Please refer to Health maintenance section.  Return to care in 1 year for next preventative visit.     Subjective:  HPI:  He has no acute complaints today.    Lifestyle Diet: Balanced. Plenty of fruits and vegetables.  Exercise: 40 minutes daily at home.   Depression screen PHQ 2/9 11/07/2020  Decreased Interest 0  Down, Depressed, Hopeless 0  PHQ - 2 Score 0    Health Maintenance Due  Topic Date Due  . COVID-19 Vaccine (1) Never done  . HIV Screening  Never done     ROS: Per HPI, otherwise a complete review of systems was negative.   PMH:  The following were reviewed and entered/updated in epic: Past Medical History:  Diagnosis Date  . BACK PAIN 10/27/2008  . BACK PAIN 10/27/2008   Qualifier: Diagnosis of  By: Amador Cunas  MD, Janett Labella   . Elevated transaminase level 02/08/2011  . Gynecomastia, male 01/10/2016  . GYNECOMASTIA, UNILATERAL 07/07/2008  . Head injury, closed   . HYPERLIPIDEMIA 10/23/2007  . Hypogonadism in male 10/02/2015  . OTALGIA 09/21/2007  . 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 PVCs (premature ventricular contractions) 05/06/2018  . Testosterone deficiency    Patient Active Problem List   Diagnosis Date Noted  . Prediabetes 09/11/2018  . 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 Dyslipidemia 10/23/2007   Past Surgical History:  Procedure Laterality Date  . NO PAST SURGERIES      Family History  Problem Relation Age of Onset  . Diabetes Father   . Hypercholesterolemia Father   . Colon cancer Neg Hx   . Other Neg Hx        hypogonadism    Medications- reviewed and updated No current outpatient medications on file.   No current facility-administered medications for this visit.    Allergies-reviewed and updated No Known Allergies  Social  History   Socioeconomic History  . Marital status: Married    Spouse name: Not on file  . Number of children: Not on file  . Years of education: Not on file  . Highest education level: Not on file  Occupational History  . Not on file  Tobacco Use  . Smoking status: Never Smoker  . Smokeless tobacco: Never Used  Substance  and Sexual Activity  . Alcohol use: No  . Drug use: No  . Sexual activity: Not on file  Other Topics Concern  . Not on file  Social History Narrative  . Not on file   Social Determinants of Health   Financial Resource Strain: Not on file  Food Insecurity: Not on file  Transportation Needs: Not on file  Physical Activity: Not on file  Stress: Not on file  Social Connections: Not on file        Objective:  Physical Exam: BP (!) 159/89   Pulse 67   Temp 98.6 F (37 C) (Temporal)   Ht 5\' 7"  (1.702 m)   Wt 172 lb 3.2 oz (78.1 kg)   SpO2 99%   BMI 26.97 kg/m   Body mass index is 26.97 kg/m. Wt Readings from Last 3 Encounters:  11/07/20 172 lb 3.2 oz (78.1 kg)  08/18/20 169 lb 12.8 oz (77 kg)  10/29/19 171 lb 8 oz (77.8 kg)   Gen: NAD, resting comfortably HEENT: TMs normal bilaterally. OP clear. No thyromegaly noted.  CV: RRR with no murmurs appreciated Pulm: NWOB, CTAB with no crackles, wheezes, or rhonchi GI: Normal bowel sounds present. Soft, Nontender, Nondistended. MSK: no edema, cyanosis, or clubbing noted Skin: warm, dry Neuro: CN2-12 grossly intact. Strength 5/5 in upper and lower extremities. Reflexes symmetric and intact bilaterally.  Psych: Normal affect and thought content     Derek M. 10/31/19, MD 11/07/2020 1:57 PM

## 2020-11-07 NOTE — Assessment & Plan Note (Addendum)
Check lipids. Discussed lifestyle modifications.  

## 2020-11-08 NOTE — Progress Notes (Signed)
Please inform patient of the following:  Cholesterol borderline elevated but everything else is normal.  He would benefit from starting Lipitor 40 mg daily to improve numbers and lower risk of heart attack and stroke.  Please send in if he is willing to start.  Otherwise he should continue working on diet and exercise and we can recheck in a year.

## 2020-12-01 IMAGING — DX DG ABDOMEN 1V
1 series · 1 of 1 positions shown · non-contrast
Comparison: None.

CLINICAL DATA: Abdominal pain

EXAM:
ABDOMEN - 1 VIEW

[kub ap]
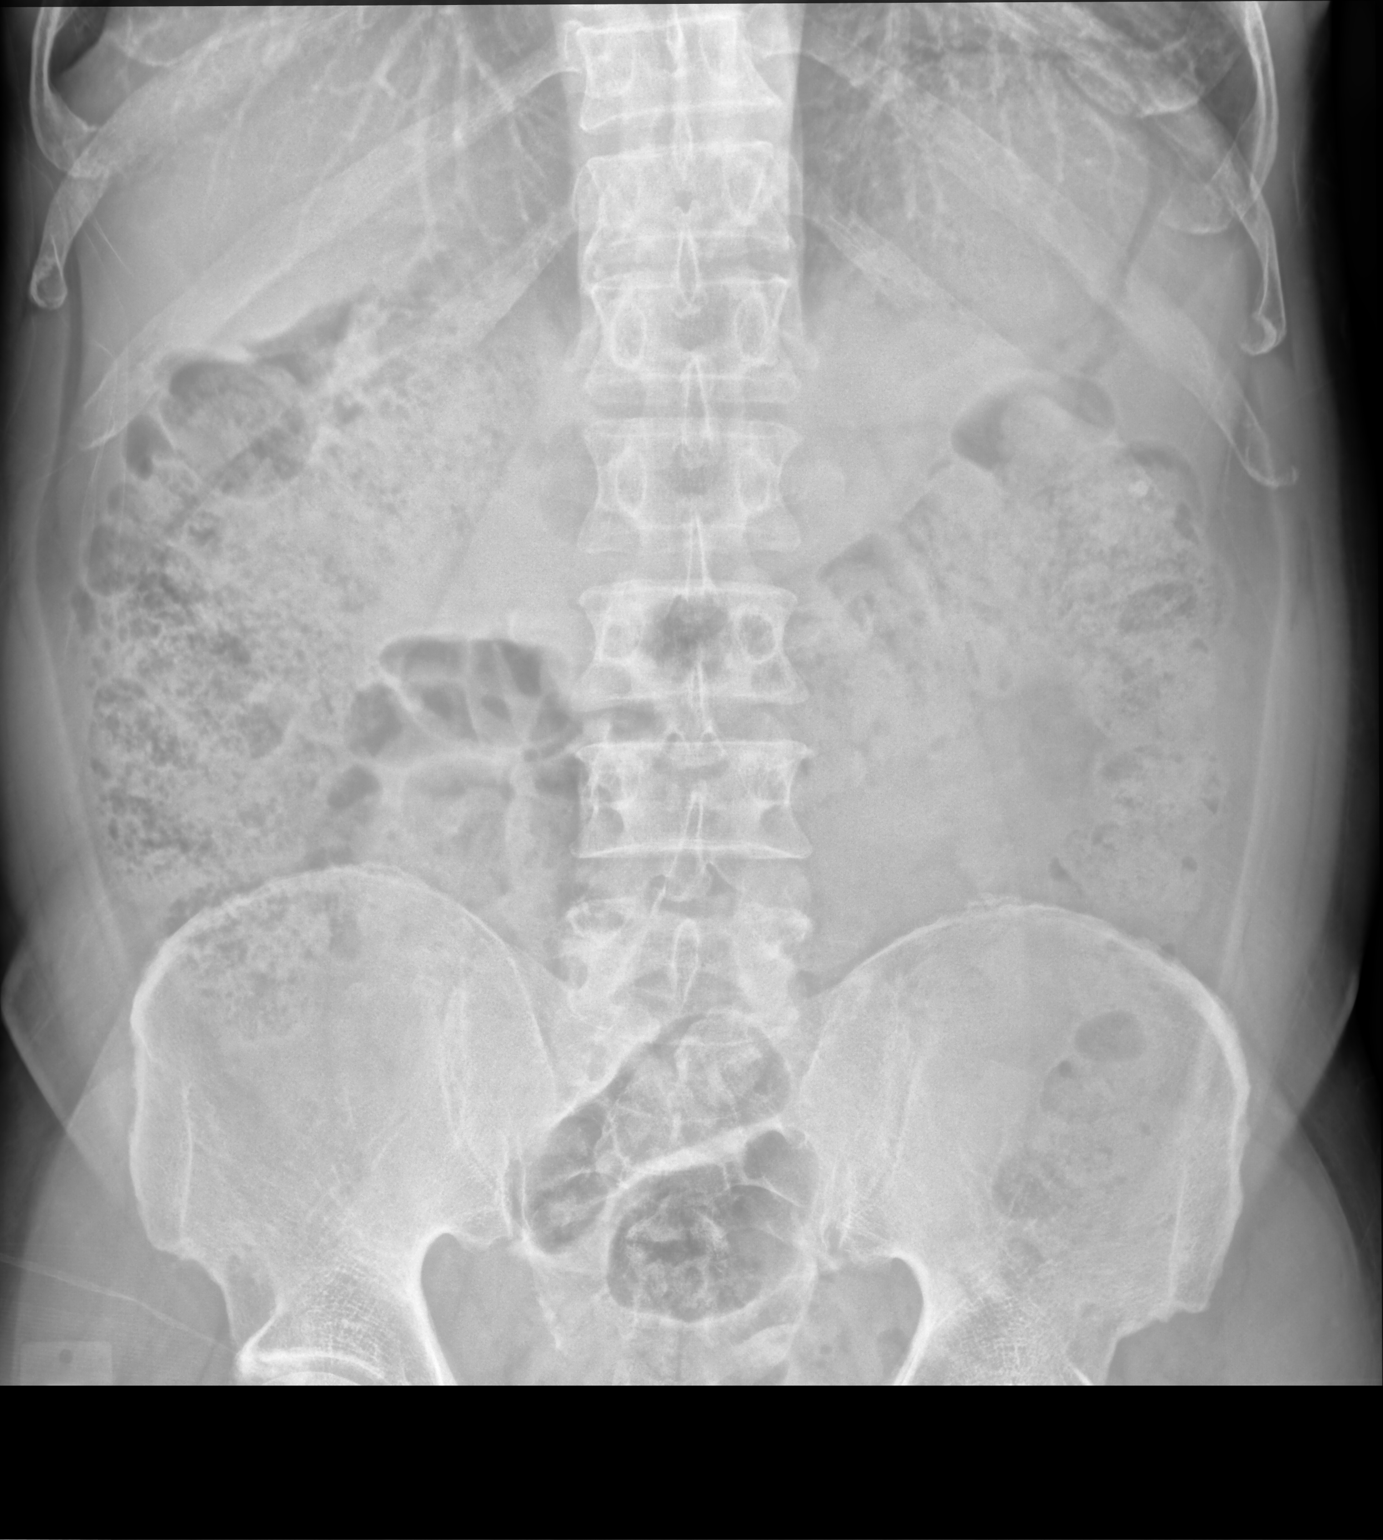

[1 of 1 positions shown; findings below may reference images not displayed]

FINDINGS: Scattered large and small bowel gas is noted. Considerable retained
fecal material is noted throughout the colon. No free air is seen.
No abnormal mass or abnormal calcifications are noted. Bony
structures are within normal limits.
IMPRESSION: Changes consistent with constipation.

## 2021-03-01 ENCOUNTER — Encounter (INDEPENDENT_AMBULATORY_CARE_PROVIDER_SITE_OTHER): Payer: Self-pay | Admitting: Ophthalmology

## 2021-03-01 ENCOUNTER — Ambulatory Visit (INDEPENDENT_AMBULATORY_CARE_PROVIDER_SITE_OTHER): Payer: 59 | Admitting: Ophthalmology

## 2021-03-01 ENCOUNTER — Other Ambulatory Visit: Payer: Self-pay

## 2021-03-01 DIAGNOSIS — H43811 Vitreous degeneration, right eye: Secondary | ICD-10-CM

## 2021-03-01 DIAGNOSIS — H25813 Combined forms of age-related cataract, bilateral: Secondary | ICD-10-CM | POA: Diagnosis not present

## 2021-03-01 DIAGNOSIS — H33313 Horseshoe tear of retina without detachment, bilateral: Secondary | ICD-10-CM | POA: Diagnosis not present

## 2021-03-01 DIAGNOSIS — H3581 Retinal edema: Secondary | ICD-10-CM

## 2021-03-01 DIAGNOSIS — H5213 Myopia, bilateral: Secondary | ICD-10-CM

## 2021-03-01 NOTE — Progress Notes (Signed)
[facility] - Clinic Note  03/01/2021     CHIEF COMPLAINT Patient presents for Retina Evaluation   HISTORY OF PRESENT ILLNESS: Derek Wong is a 60 y.o. male who presents to the clinic today for:   HPI     Retina Evaluation   In right eye.  This started 2 weeks ago.  Duration of 2 weeks.  Associated Symptoms Floaters.  I, the attending physician,  performed the HPI with the patient and updated documentation appropriately.        Comments   Patient here for Retina evaluation. Referred by Dr Allena Katz. Patient states vision is good. OD sees like a hair. Started 2 weeks ago. Dr Allena Katz saw a tear OD. No eye pain. OS has no problem.      Last edited by Rennis Chris, MD on 03/02/2021  4:18 PM.    Pt is here on the referral of Dr. Allena Katz for concern of retinal tear OD, pt states he saw her yesterday bc he was seeing a hairlike floater in his right eye, pt states he has seen some fol since seeing the floater, he denies any medical hx   Referring physician: Everardo Pacific, OD [address] [place],  [place] [address]  HISTORICAL INFORMATION:   Selected notes from the MEDICAL RECORD NUMBER Referred by Dr. Nedra Hai:  Ocular Hx- PMH-    CURRENT MEDICATIONS: No current outpatient medications on file. (Ophthalmic Drugs)   No current facility-administered medications for this visit. (Ophthalmic Drugs)   No current outpatient medications on file. (Other)   No current facility-administered medications for this visit. (Other)      REVIEW OF SYSTEMS: ROS   Positive for: Eyes Last edited by Laddie Aquas, COA on 03/01/2021  8:34 AM.       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 04/26/2008   PVCs (premature ventricular contractions) 05/06/2018   Testosterone deficiency    Past Surgical History:  Procedure Laterality Date   NO PAST SURGERIES      FAMILY HISTORY Family History  Problem Relation Age of Onset   Diabetes Father    Hypercholesterolemia Father    Colon cancer Neg Hx    Other Neg Hx        hypogonadism    SOCIAL HISTORY Social History   Tobacco Use   Smoking status: Never   Smokeless tobacco: Never  Substance Use Topics   Alcohol use: No   Drug use: No         OPHTHALMIC EXAM:  Base Eye Exam     Visual Acuity (Snellen - Linear)       Right Left   Dist cc 20/20 20/20    Correction: Glasses         Tonometry (Tonopen, 8:30 AM)       Right Left   Pressure 14 19         Pupils       Dark Light Shape React APD   Right 3 2 Round Brisk None   Left  3 2 Round Brisk None         Visual Fields (Counting fingers)       Left Right    Full Full         Extraocular Movement       Right Left    Full, Ortho Full, Ortho         Neuro/Psych     Oriented x3: Yes   Mood/Affect: Normal         Dilation     Both eyes: 1.0% Mydriacyl, 2.5% Phenylephrine @ 8:30 AM           Slit Lamp and Fundus Exam     Slit Lamp Exam       Right Left   Lids/Lashes Dermatochalasis - upper lid Dermatochalasis - upper lid   Conjunctiva/Sclera mild melanosis mild melanosis   Cornea 1+ inferior PEE, trace tear film debris 1+ inferior PEE, trace tear film debris   Anterior Chamber deep, clear, narrow temporal angle deep, clear, narrow temporal angle   Iris Round and dilated Round and dilated   Lens 2-3+ Nuclear sclerosis, 2-3+ Cortical cataract 2-3+ Nuclear sclerosis, 2-3+ Cortical cataract   Vitreous Vitreous syneresis, Posterior vitreous detachment, Weiss ring, vitreous  condensations Vitreous syneresis         Fundus Exam       Right Left   Disc Pink and Sharp, temporal PPA Pink and Sharp, temporal PPA, tilted, peripapillary heme at 1030   C/D Ratio 0.4 0.6   Macula Flat, Blunted foveal reflex, RPE mottling and clumping, No heme or edema Flat, Blunted foveal reflex, mild RPE mottling and clumping, No heme or edema   Vessels mild attenuation, mild tortuousity mild attenuation, mild tortuousity   Periphery Attached, small retinal break at 0530, small tear at 0700 almost to ora Attached, pigmented cystoid degeneration, round hole at 0430 within bed of pigmented cystoind degeneration           Refraction     Wearing Rx       Sphere Cylinder Axis Add   Right -4.25 +1.25 080 +2.50   Left -4.25 +1.25 177 +2.50            IMAGING AND PROCEDURES  Imaging and Procedures for 03/01/2021  OCT, Retina - OU - Both Eyes       Right Eye Quality was good. Central Foveal Thickness: 232. Progression has no prior data. Findings include normal foveal contour, no IRF, no SRF, myopic contour (Trace vitreous opacities).   Left Eye Quality was good. Central Foveal Thickness: 232. Progression has no prior data. Findings include normal foveal contour, no IRF, no SRF, myopic contour (Partial PVD).   Notes *Images captured and stored on drive  Diagnosis / Impression:  NFP; no IRF/SRF OU Myopic contour OU  Clinical management:  See below  Abbreviations: NFP - Normal foveal profile. CME - cystoid macular edema. PED - pigment epithelial detachment. IRF - intraretinal fluid. SRF - subretinal fluid. EZ - ellipsoid zone. ERM - epiretinal membrane. ORA - outer retinal atrophy. ORT - outer retinal tubulation. SRHM - subretinal hyper-reflective material. IRHM - intraretinal hyper-reflective material              ASSESSMENT/PLAN:    ICD-10-CM   1. Retinal tear of both eyes  H33.313     2. Posterior vitreous detachment of right eye  H43.811     3.  Retinal edema  H35.81 OCT, Retina - OU - Both Eyes    4.  Combined forms of age-related cataract of both eyes  H25.813     5. High myopia, both eyes  H52.13       1,2. Retinal tear, OU  - subacute PVD OD  - onset of hairlike floater x2 weeks ago, presented to Dr. Allena Katz yesterday, 06.29.22 - OD: small retinal breaks at 0530 and 0700 - OS: round hole at 0430 - The incidence, risk factors, and natural history of retinal tear was discussed with patient.   - Potential treatment options including laser retinopexy and cryotherapy discussed with patient. - recommend laser retinopexy OU, OD first today, 06.30.22 - pt unable to undergo laser procedure today, and wishes to come back on Tuesday, 07.05.22 - f/u Tuesday, July 5 at 10:00 -- laser retinopexy  3. No retinal edema on exam or OCT  4. Mixed Cataract OU - The symptoms of cataract, surgical options, and treatments and risks were discussed with patient. - discussed diagnosis and progression - not yet visually significant - monitor for now  5. High myopia w/ astigmatism OU - discussed association of high myopia with lattice degeneration and increased risk of RT/RD   Ophthalmic Meds Ordered this visit:  No orders of the defined types were placed in this encounter.     Return in about 5 days (around 03/06/2021) for Laser retinopexy OD.  There are no Patient Instructions on file for this visit.   Explained the diagnoses, plan, and follow up with the patient and they expressed understanding.  Patient expressed understanding of the importance of proper follow up care.   This document serves as a record of services personally performed by Karie Chimera, MD, PhD. It was created on their behalf by Glee Arvin. Manson Passey, OA an ophthalmic technician. The creation of this record is the provider's dictation and/or activities during the visit.    Electronically signed by: Glee Arvin. Manson Passey, [place] 06.30.2022 4:42 PM  Karie Chimera, M.D.,  Ph.D. Diseases & Surgery of the Retina and Vitreous [facility]  I have reviewed the above documentation for accuracy and completeness, and I agree with the above. Karie Chimera, M.D., Ph.D. 03/02/21 4:42 PM   Abbreviations: M myopia (nearsighted); A astigmatism; H hyperopia (farsighted); P presbyopia; Mrx spectacle prescription;  CTL contact lenses; OD right eye; OS left eye; OU both eyes  XT exotropia; ET esotropia; PEK punctate epithelial keratitis; PEE punctate epithelial erosions; DES dry eye syndrome; MGD meibomian gland dysfunction; ATs artificial tears; PFAT's preservative free artificial tears; NSC nuclear sclerotic cataract; PSC posterior subcapsular cataract; ERM epi-retinal membrane; PVD posterior vitreous detachment; RD retinal detachment; DM diabetes mellitus; DR diabetic retinopathy; NPDR non-proliferative diabetic retinopathy; PDR proliferative diabetic retinopathy; CSME clinically significant macular edema; DME diabetic macular edema; dbh dot blot hemorrhages; CWS cotton wool spot; POAG primary open angle glaucoma; C/D cup-to-disc ratio; HVF humphrey visual field; GVF goldmann visual field; OCT optical coherence tomography; IOP intraocular pressure; BRVO Branch retinal vein occlusion; CRVO central retinal vein occlusion; CRAO central retinal artery occlusion; BRAO branch retinal artery occlusion; RT retinal tear; SB scleral buckle; PPV pars plana vitrectomy; VH Vitreous hemorrhage; PRP panretinal laser photocoagulation; IVK intravitreal kenalog; VMT vitreomacular traction; MH Macular hole;  NVD neovascularization of the disc; NVE neovascularization elsewhere; AREDS age related eye disease study; ARMD age related macular degeneration; POAG primary open angle glaucoma; EBMD epithelial/anterior basement membrane dystrophy; ACIOL anterior chamber intraocular lens; IOL intraocular lens; PCIOL posterior chamber intraocular lens; Phaco/IOL phacoemulsification with  intraocular lens placement; PRK photorefractive  keratectomy; LASIK laser assisted in situ keratomileusis; HTN hypertension; DM diabetes mellitus; COPD chronic obstructive pulmonary disease

## 2021-03-06 ENCOUNTER — Ambulatory Visit (INDEPENDENT_AMBULATORY_CARE_PROVIDER_SITE_OTHER): Payer: 59 | Admitting: Ophthalmology

## 2021-03-06 ENCOUNTER — Encounter (INDEPENDENT_AMBULATORY_CARE_PROVIDER_SITE_OTHER): Payer: Self-pay | Admitting: Ophthalmology

## 2021-03-06 ENCOUNTER — Other Ambulatory Visit: Payer: Self-pay

## 2021-03-06 DIAGNOSIS — H43811 Vitreous degeneration, right eye: Secondary | ICD-10-CM | POA: Diagnosis not present

## 2021-03-06 DIAGNOSIS — H3581 Retinal edema: Secondary | ICD-10-CM | POA: Diagnosis not present

## 2021-03-06 DIAGNOSIS — H33313 Horseshoe tear of retina without detachment, bilateral: Secondary | ICD-10-CM

## 2021-03-06 DIAGNOSIS — H5213 Myopia, bilateral: Secondary | ICD-10-CM

## 2021-03-06 DIAGNOSIS — H25813 Combined forms of age-related cataract, bilateral: Secondary | ICD-10-CM

## 2021-03-06 MED ORDER — PREDNISOLONE ACETATE 1 % OP SUSP: 1.0000 [drp] | Freq: Four times a day (QID) | OPHTHALMIC | 0 refills | Status: AC

## 2021-03-06 NOTE — Progress Notes (Signed)
[facility] Clinic Note  03/06/2021     CHIEF COMPLAINT Patient presents for Retina Follow Up   HISTORY OF PRESENT ILLNESS: Derek Wong is a 60 y.o. male who presents to the clinic today for:   HPI     Retina Follow Up   Patient presents with  Retinal Break/Detachment.  In both eyes.  This started days ago.  Severity is mild.  Duration of 5 days.  Since onset it is stable.  I, the attending physician,  performed the HPI with the patient and updated documentation appropriately.        Comments   60 y/o male pt here for 5 day f/u for ret tears OU, and is here for laser retinopexy OD today, 7.5.22.  No change in [place] OU.  Denies pain, FOL, floaters.  No gtts.      Last edited by Bernarda Caffey, MD on 03/06/2021 11:48 AM.     Referring physician: Vivi Barrack, MD [place],  [place] [address]  HISTORICAL INFORMATION:   Selected notes from the MEDICAL RECORD NUMBER    CURRENT MEDICATIONS: Current Outpatient Medications (Ophthalmic Drugs)  Medication Sig   prednisoLONE acetate (PRED FORTE) 1 % ophthalmic suspension Place 1 drop into the right eye 4 (four) times daily for 7 days.   No current facility-administered medications for this visit. (Ophthalmic Drugs)   No current outpatient medications on file. (Other)   No current facility-administered medications for this visit. (Other)      REVIEW OF SYSTEMS: ROS   Positive for: Eyes Negative for: Constitutional, Gastrointestinal, Neurological, Skin, Genitourinary, Musculoskeletal, HENT, Endocrine, Cardiovascular, Respiratory, Psychiatric, Allergic/Imm, Heme/Lymph Last edited by Matthew Folks, COA on 03/06/2021 10:22 AM.        ALLERGIES No Known Allergies  PAST MEDICAL HISTORY Past Medical History:  Diagnosis Date   BACK PAIN 10/27/2008   BACK PAIN 10/27/2008   Qualifier: Diagnosis of  By: Burnice Logan  MD, Doretha Sou    Cataract    Elevated transaminase level 02/08/2011    Gynecomastia, male 01/10/2016   GYNECOMASTIA, UNILATERAL 07/07/2008   Head injury, closed    HYPERLIPIDEMIA 10/23/2007   Hypogonadism in male 10/02/2015   OTALGIA 09/21/2007   POSITIVE TB SKIN TEST, WITHOUT TUBERCULOSIS 04/26/2008   PVCs (premature ventricular contractions) 05/06/2018   Testosterone deficiency    Past Surgical History:  Procedure Laterality Date   NO PAST SURGERIES      FAMILY HISTORY Family History  Problem Relation Age of Onset   Diabetes Father    Hypercholesterolemia Father    Colon cancer Neg Hx    Other Neg Hx        hypogonadism    SOCIAL HISTORY Social History   Tobacco Use   Smoking status: Never   Smokeless tobacco: Never  Substance Use Topics   Alcohol use: No   Drug use: No         OPHTHALMIC EXAM:  Base Eye Exam     Visual Acuity (Snellen - Linear)       Right Left   Dist cc 20/20 20/20    Correction: Glasses         Tonometry (Tonopen, 10:26 AM)       Right Left   Pressure 19 13         Pupils       Dark Light Shape React APD   Right 3 2 Round Brisk None   Left 3 2 Round  Brisk None         Visual Fields (Counting fingers)       Left Right    Full Full         Extraocular Movement       Right Left    Full, Ortho Full, Ortho         Neuro/Psych     Oriented x3: Yes   Mood/Affect: Normal         Dilation     Both eyes: 1.0% Mydriacyl, 2.5% Phenylephrine @ 10:26 AM           Slit Lamp and Fundus Exam     Slit Lamp Exam       Right Left   Lids/Lashes Dermatochalasis - upper lid Dermatochalasis - upper lid   Conjunctiva/Sclera mild melanosis mild melanosis   Cornea 1+ inferior PEE, trace tear film debris 1+ inferior PEE, trace tear film debris   Anterior Chamber deep, clear, narrow temporal angle deep, clear, narrow temporal angle   Iris Round and dilated Round and dilated   Lens 2-3+ Nuclear sclerosis, 2-3+ Cortical cataract 2-3+ Nuclear sclerosis, 2-3+ Cortical cataract    Vitreous Vitreous syneresis, Posterior vitreous detachment, Weiss ring, vitreous condensations Vitreous syneresis         Fundus Exam       Right Left   Disc Pink and Sharp, temporal PPA Pink and Sharp, temporal PPA, tilted, peripapillary heme at 1030   C/D Ratio 0.4 0.6   Macula Flat, Blunted foveal reflex, RPE mottling and clumping, No heme or edema Flat, Blunted foveal reflex, mild RPE mottling and clumping, No heme or edema   Vessels mild attenuation, mild tortuousity mild attenuation, mild tortuousity   Periphery Attached, small retinal break at 0600 with mild pigment changes; no SRF Attached, pigmented cystoid degeneration, round hole at 0430 within bed of pigmented cystoind degeneration            IMAGING AND PROCEDURES  Imaging and Procedures for 03/06/2021  OCT, Retina - OU - Both Eyes       Right Eye Quality was good. Central Foveal Thickness: 236. Progression has been stable. Findings include normal foveal contour, no IRF, no SRF, myopic contour (Trace vitreous opacities).   Left Eye Quality was good. Central Foveal Thickness: 236. Progression has been stable. Findings include normal foveal contour, no IRF, no SRF, myopic contour (Partial PVD).   Notes *Images captured and stored on drive  Diagnosis / Impression:  NFP; no IRF/SRF OU Myopic contour OU  Clinical management:  See below  Abbreviations: NFP - Normal foveal profile. CME - cystoid macular edema. PED - pigment epithelial detachment. IRF - intraretinal fluid. SRF - subretinal fluid. EZ - ellipsoid zone. ERM - epiretinal membrane. ORA - outer retinal atrophy. ORT - outer retinal tubulation. SRHM - subretinal hyper-reflective material. IRHM - intraretinal hyper-reflective material      Repair Retinal Breaks, Laser - OD - Right Eye       Time Out Confirmed correct patient, procedure, site, and patient consented.   Anesthesia Topical anesthesia was used. Anesthetic medications included Proparacaine  0.5%.   Notes LASER PROCEDURE NOTE  Procedure:  Barrier laser retinopexy using slit lamp laser, RIGHT eye   Diagnosis:   Retinal tear, RIGHT eye                     small tear at 6 o'clock  Surgeon: Bernarda Caffey, MD, PhD  Anesthesia: Topical  Informed consent obtained, operative  eye marked, and time out performed prior to initiation of laser.   Laser settings:  Lumenis [id number] laser, slit lamp Lens: [place] PRP 165 Power: 250 mW Spot size: 200 microns Duration: 30 msec  # spots: 168  Placement of laser: Using a [place]ster PRP 165 contact lens at the slit lamp, laser was placed in three confluent rows around tear at 6 oclock equator  Complications: None.  Patient tolerated the procedure well and received written and verbal post-procedure care information/education.               ASSESSMENT/PLAN:    ICD-10-CM   1. Retinal tear of both eyes  H33.313 Repair Retinal Breaks, Laser - OD - Right Eye    2. Posterior vitreous detachment of right eye  H43.811     3. Retinal edema  H35.81 OCT, Retina - OU - Both Eyes    4. Combined forms of age-related cataract of both eyes  H25.813     5. High myopia, both eyes  H52.13        1,2. Retinal tear, OU  - subacute PVD OD  - onset of hairlike floater x2 weeks ago, presented to Dr. Posey Pronto 06.29.22 - OD: small retinal break 0600 - OS: round hole at 0430 - recommend laser retinopexy OD today, 07.05.22 - pt wishes to proceed - RBA of procedure discussed, questions answered - informed consent obtained and signed - see procedure note - start PF QID OD x7 days - f/u next week for laser retinopexy OS  3. No retinal edema on exam or OCT  4. Mixed Cataract OU - The symptoms of cataract, surgical options, and treatments and risks were discussed with patient. - discussed diagnosis and progression - not yet visually significant - monitor for now  5. High myopia w/ astigmatism OU - discussed association of high myopia  with lattice degeneration and increased risk of RT/RD   Ophthalmic Meds Ordered this visit:  Meds ordered this encounter  Medications   prednisoLONE acetate (PRED FORTE) 1 % ophthalmic suspension    Sig: Place 1 drop into the right eye 4 (four) times daily for 7 days.    Dispense:  10 mL    Refill:  0       Return in about 9 days (around 03/15/2021) for POV OD, Laser OS.  There are no Patient Instructions on file for this visit.   Explained the diagnoses, plan, and follow up with the patient and they expressed understanding.  Patient expressed understanding of the importance of proper follow up care.   This document serves as a record of services personally performed by Gardiner Sleeper, MD, PhD. It was created on their behalf by Estill Bakes, COT an ophthalmic technician. The creation of this record is the provider's dictation and/or activities during the visit.    Electronically signed by: Estill Bakes, COT 7.5.22 @ 11:56 AM   Gardiner Sleeper, M.D., Ph.D. Diseases & Surgery of the Retina and Vitreous [facility] 7.5.22  I have reviewed the above documentation for accuracy and completeness, and I agree with the above. Gardiner Sleeper, M.D., Ph.D. 03/06/21 11:56 AM  Abbreviations: M myopia (nearsighted); A astigmatism; H hyperopia (farsighted); P presbyopia; Mrx spectacle prescription;  CTL contact lenses; OD right eye; OS left eye; OU both eyes  XT exotropia; ET esotropia; PEK punctate epithelial keratitis; PEE punctate epithelial erosions; DES dry eye syndrome; MGD meibomian gland dysfunction; ATs artificial tears; PFAT's preservative free artificial tears;  [place] nuclear sclerotic cataract; PSC posterior subcapsular cataract; ERM epi-retinal membrane; PVD posterior vitreous detachment; RD retinal detachment; DM diabetes mellitus; DR diabetic retinopathy; NPDR non-proliferative diabetic retinopathy; PDR proliferative diabetic retinopathy; CSME clinically  significant macular edema; DME diabetic macular edema; dbh dot blot hemorrhages; CWS cotton wool spot; POAG primary open angle glaucoma; C/D cup-to-disc ratio; HVF humphrey visual field; GVF goldmann visual field; OCT optical coherence tomography; IOP intraocular pressure; BRVO Branch retinal vein occlusion; CRVO central retinal vein occlusion; CRAO central retinal artery occlusion; BRAO branch retinal artery occlusion; RT retinal tear; SB scleral buckle; PPV pars plana vitrectomy; VH Vitreous hemorrhage; PRP panretinal laser photocoagulation; IVK intravitreal kenalog; VMT vitreomacular traction; MH Macular hole;  NVD neovascularization of the disc; NVE neovascularization elsewhere; AREDS age related eye disease study; ARMD age related macular degeneration; POAG primary open angle glaucoma; EBMD epithelial/anterior basement membrane dystrophy; ACIOL anterior chamber intraocular lens; IOL intraocular lens; PCIOL posterior chamber intraocular lens; Phaco/IOL phacoemulsification with intraocular lens placement; [place] photorefractive keratectomy; LASIK laser assisted in situ keratomileusis; HTN hypertension; DM diabetes mellitus; COPD chronic obstructive pulmonary disease

## 2021-03-12 ENCOUNTER — Encounter (INDEPENDENT_AMBULATORY_CARE_PROVIDER_SITE_OTHER): Payer: Self-pay | Admitting: Ophthalmology

## 2021-03-13 NOTE — Progress Notes (Signed)
[facility] - Clinic Note  03/15/2021     CHIEF COMPLAINT Patient presents for Retina Follow Up   HISTORY OF PRESENT ILLNESS: Derek Wong is a 60 y.o. male who presents to the clinic today for:   HPI     Retina Follow Up   Patient presents with  Other.  In left eye.  I, the attending physician,  performed the HPI with the patient and updated documentation appropriately.        Comments   Retinopexy OS today for retinal tear.  Since laser OD, he is seeing a floater that looks like a hair.  Almost always there.  Denies FOLs or decrease in vision.      Last edited by Rennis Chris, MD on 03/15/2021  2:09 PM.    Pt had laser retinopexy OD on 07.05.22, pt states since then he has been seeing a hairlike floater in his vision, he is here for laser retinopexy OS today  Referring physician: Ardith Dark, MD [address] [place],  [place] [address]  HISTORICAL INFORMATION:   Selected notes from the MEDICAL RECORD NUMBER    CURRENT MEDICATIONS: No current outpatient medications on file. (Ophthalmic Drugs)   No current facility-administered medications for this visit. (Ophthalmic Drugs)   No current outpatient medications on file. (Other)   No current facility-administered medications for this visit. (Other)      REVIEW OF SYSTEMS: ROS   Positive for: Eyes Negative for: Constitutional, Gastrointestinal, Neurological, Skin, Genitourinary, Musculoskeletal, HENT, Endocrine, Cardiovascular, Respiratory, Psychiatric, Allergic/Imm, Heme/Lymph Last edited by Joni Reining, COA on 03/15/2021  7:59 AM.         ALLERGIES No Known Allergies  PAST MEDICAL HISTORY Past Medical History:  Diagnosis Date   BACK PAIN 10/27/2008   BACK PAIN 10/27/2008   Qualifier: Diagnosis of  By: Amador Cunas  MD, Janett Labella    Cataract    Elevated transaminase level 02/08/2011   Gynecomastia, male 01/10/2016   GYNECOMASTIA, UNILATERAL 07/07/2008   Head injury,  closed    HYPERLIPIDEMIA 10/23/2007   Hypogonadism in male 10/02/2015   OTALGIA 09/21/2007   POSITIVE TB SKIN TEST, WITHOUT TUBERCULOSIS 04/26/2008   PVCs (premature ventricular contractions) 05/06/2018   Testosterone deficiency    Past Surgical History:  Procedure Laterality Date   NO PAST SURGERIES      FAMILY HISTORY Family History  Problem Relation Age of Onset   Diabetes Father    Hypercholesterolemia Father    Colon cancer Neg Hx    Other Neg Hx        hypogonadism    SOCIAL HISTORY Social History   Tobacco Use   Smoking status: Never   Smokeless tobacco: Never  Substance Use Topics   Alcohol use: No   Drug use: No         OPHTHALMIC EXAM:  Base Eye Exam     Visual Acuity (Snellen - Linear)       Right Left   Dist cc 20/20 20/20         Tonometry (Tonopen, 8:03 AM)       Right Left   Pressure 11 16         Pupils       Dark Light Shape React APD   Right 5 4 Round Brisk None   Left 5 4 Round Brisk None         Visual Fields (Counting fingers)       Left Right  Full Full         Extraocular Movement       Right Left    Full Full         Neuro/Psych     Oriented x3: Yes   Mood/Affect: Normal         Dilation     Both eyes: 1.0% Mydriacyl, 2.5% Phenylephrine @ 8:05 AM           Slit Lamp and Fundus Exam     Slit Lamp Exam       Right Left   Lids/Lashes Dermatochalasis - upper lid Dermatochalasis - upper lid   Conjunctiva/Sclera mild melanosis mild melanosis   Cornea 1+ inferior PEE, trace tear film debris 1+ inferior PEE, trace tear film debris   Anterior Chamber deep, clear, narrow temporal angle deep, clear, narrow temporal angle   Iris Round and dilated Round and dilated   Lens 2-3+ Nuclear sclerosis, 2-3+ Cortical cataract 2-3+ Nuclear sclerosis, 2-3+ Cortical cataract   Vitreous Vitreous syneresis, Posterior vitreous detachment, Weiss ring, vitreous condensations Vitreous syneresis          Fundus Exam       Right Left   Disc Pink and Sharp, temporal PPA, mild tilt Pink and Sharp, temporal PPA, tilted, peripapillary heme at 1030   C/D Ratio 0.4 0.6   Macula Flat, Blunted foveal reflex, RPE mottling and clumping, No heme or edema Flat, Blunted foveal reflex, mild RPE mottling and clumping, No heme or edema   Vessels mild attenuation, mild tortuousity, mild AV crossing changes mild attenuation, mild tortuousity   Periphery Attached, small retinal break at 0600 with mild pigment changes -- now operculated -- good early laser changes with mild IRH; no SRF Attached, pigmented cystoid degeneration, round hole at 0430 within bed of pigmented cystoid degeneration           Refraction     Wearing Rx       Sphere Cylinder Axis Add   Right -4.25 +1.25 080 +2.50   Left -4.25 +1.25 177 +2.50            IMAGING AND PROCEDURES  Imaging and Procedures for 03/15/2021  OCT, Retina - OU - Both Eyes       Right Eye Quality was good. Central Foveal Thickness: 236. Progression has been stable. Findings include normal foveal contour, no IRF, no SRF, myopic contour (Trace vitreous opacities).   Left Eye Quality was good. Central Foveal Thickness: 238. Progression has been stable. Findings include normal foveal contour, no IRF, no SRF, myopic contour (Partial PVD).   Notes *Images captured and stored on drive  Diagnosis / Impression:  NFP; no IRF/SRF OU Myopic contour OU  Clinical management:  See below  Abbreviations: NFP - Normal foveal profile. CME - cystoid macular edema. PED - pigment epithelial detachment. IRF - intraretinal fluid. SRF - subretinal fluid. EZ - ellipsoid zone. ERM - epiretinal membrane. ORA - outer retinal atrophy. ORT - outer retinal tubulation. SRHM - subretinal hyper-reflective material. IRHM - intraretinal hyper-reflective material      Repair Retinal Breaks, Laser - OS - Left Eye       LASER PROCEDURE NOTE  Procedure:  Barrier laser  retinopexy using slit lamp laser, left eye   Diagnosis:   Retinal hole, left eye                     Round hole at 0430 o'clock anterior to equator   Surgeon: Rennis ChrisBrian Edman Lipsey,  MD, PhD  Anesthesia: Topical  Informed consent obtained, operative eye marked, and time out performed prior to initiation of laser.   Laser settings:  Lumenis [id number] laser, slit lamp Lens: Mainster PRP 165 Power: 250 mW Spot size: 200 microns Duration: 30 msec  # spots: 127  Placement of laser: Using a Mainster PRP 165 contact lens at the slit lamp, laser was placed in three confluent rows around hole at 0430 oclock anterior to equator.  Complications: None.  Patient tolerated the procedure well and received written and verbal post-procedure care information/education.                ASSESSMENT/PLAN:    ICD-10-CM   1. Retinal tear of both eyes  H33.313 Repair Retinal Breaks, Laser - OS - Left Eye    2. Posterior vitreous detachment of right eye  H43.811     3. Retinal edema  H35.81 OCT, Retina - OU - Both Eyes    4. Combined forms of age-related cataract of both eyes  H25.813     5. High myopia, both eyes  H52.13      1,2. Retinal tear, OU  - subacute PVD OD  - onset of hairlike floater x2 weeks ago, presented to Dr. Allena Katz 06.29.22 - OD: small retinal break 0600 -- now operculated  - OS: round hole at 0430 - s/p laser retinopexy OD (07.05.22) -- good early laser changes and mild IRH - recommend laser retinopexy OS today, 07.14.22 - pt wishes to proceed - RBA of procedure discussed, questions answered - informed consent obtained and signed - see procedure note  - start PF QID OS x7 days - f/u 3 weeks, DFE, OCT  3. No retinal edema on exam or OCT  4. Mixed Cataract OU - The symptoms of cataract, surgical options, and treatments and risks were discussed with patient. - discussed diagnosis and progression - not yet visually significant - monitor for now  5. High myopia w/  astigmatism OU - discussed association of high myopia with lattice degeneration and increased risk of RT/RD   Ophthalmic Meds Ordered this visit:  No orders of the defined types were placed in this encounter.      Return in about 3 weeks (around 04/05/2021) for post laser OU - Dilated Exam, OCT.  There are no Patient Instructions on file for this visit.   Explained the diagnoses, plan, and follow up with the patient and they expressed understanding.  Patient expressed understanding of the importance of proper follow up care.   This document serves as a record of services personally performed by Karie Chimera, MD, PhD. It was created on their behalf by Joni Reining, an ophthalmic technician. The creation of this record is the provider's dictation and/or activities during the visit.    Electronically signed by: Joni Reining COA, 03/15/21  2:16 PM  This document serves as a record of services personally performed by Karie Chimera, MD, PhD. It was created on their behalf by Glee Arvin. Manson Passey, OA an ophthalmic technician. The creation of this record is the provider's dictation and/or activities during the visit.    Electronically signed by: Glee Arvin. Manson Passey, [place] 07.14.2022 2:16 PM   Karie Chimera, M.D., Ph.D. Diseases & Surgery of the Retina and Vitreous [facility] 03/15/2021  I have reviewed the above documentation for accuracy and completeness, and I agree with the above. Karie Chimera, M.D., Ph.D. 03/15/21 2:16 PM    Abbreviations: Judie Petit myopia (  nearsighted); A astigmatism; H hyperopia (farsighted); P presbyopia; Mrx spectacle prescription;  CTL contact lenses; OD right eye; OS left eye; OU both eyes  XT exotropia; ET esotropia; PEK punctate epithelial keratitis; PEE punctate epithelial erosions; DES dry eye syndrome; MGD meibomian gland dysfunction; ATs artificial tears; PFAT's preservative free artificial tears; NSC nuclear sclerotic cataract; PSC posterior  subcapsular cataract; ERM epi-retinal membrane; PVD posterior vitreous detachment; RD retinal detachment; DM diabetes mellitus; DR diabetic retinopathy; NPDR non-proliferative diabetic retinopathy; PDR proliferative diabetic retinopathy; CSME clinically significant macular edema; DME diabetic macular edema; dbh dot blot hemorrhages; CWS cotton wool spot; POAG primary open angle glaucoma; C/D cup-to-disc ratio; HVF humphrey visual field; GVF goldmann visual field; OCT optical coherence tomography; IOP intraocular pressure; BRVO Branch retinal vein occlusion; CRVO central retinal vein occlusion; CRAO central retinal artery occlusion; BRAO branch retinal artery occlusion; RT retinal tear; SB scleral buckle; PPV pars plana vitrectomy; VH Vitreous hemorrhage; PRP panretinal laser photocoagulation; IVK intravitreal kenalog; VMT vitreomacular traction; MH Macular hole;  NVD neovascularization of the disc; NVE neovascularization elsewhere; AREDS age related eye disease study; ARMD age related macular degeneration; POAG primary open angle glaucoma; EBMD epithelial/anterior basement membrane dystrophy; ACIOL anterior chamber intraocular lens; IOL intraocular lens; PCIOL posterior chamber intraocular lens; Phaco/IOL phacoemulsification with intraocular lens placement; PRK photorefractive keratectomy; LASIK laser assisted in situ keratomileusis; HTN hypertension; DM diabetes mellitus; COPD chronic obstructive pulmonary disease

## 2021-03-15 ENCOUNTER — Encounter (INDEPENDENT_AMBULATORY_CARE_PROVIDER_SITE_OTHER): Payer: Self-pay | Admitting: Ophthalmology

## 2021-03-15 ENCOUNTER — Ambulatory Visit (INDEPENDENT_AMBULATORY_CARE_PROVIDER_SITE_OTHER): Payer: 59 | Admitting: Ophthalmology

## 2021-03-15 ENCOUNTER — Other Ambulatory Visit: Payer: Self-pay

## 2021-03-15 DIAGNOSIS — H25813 Combined forms of age-related cataract, bilateral: Secondary | ICD-10-CM

## 2021-03-15 DIAGNOSIS — H5213 Myopia, bilateral: Secondary | ICD-10-CM

## 2021-03-15 DIAGNOSIS — H33313 Horseshoe tear of retina without detachment, bilateral: Secondary | ICD-10-CM | POA: Diagnosis not present

## 2021-03-15 DIAGNOSIS — H43811 Vitreous degeneration, right eye: Secondary | ICD-10-CM

## 2021-03-15 DIAGNOSIS — H3581 Retinal edema: Secondary | ICD-10-CM

## 2021-04-03 NOTE — Progress Notes (Signed)
[facility] - Clinic Note  04/05/2021     CHIEF COMPLAINT Patient presents for Retina Follow Up   HISTORY OF PRESENT ILLNESS: Derek Wong is a 60 y.o. male who presents to the clinic today for:   HPI     Retina Follow Up   Patient presents with  Retinal Break/Detachment.  In both eyes.  This started weeks ago.  Severity is moderate.  Duration of weeks.  Since onset it is stable.  I, the attending physician,  performed the HPI with the patient and updated documentation appropriately.        Comments   Pt states vision is the same OU.  Pt states his eyes hurt on occasion.  Pt denies any new or worsening floaters or fol OU.      Last edited by Rennis Chris, MD on 04/05/2021 10:47 AM.    Pt states he has occasional eye pain, he still sees floaters in his right eye  Referring physician: Ardith Dark, MD [address] [place],  [place] [address]  HISTORICAL INFORMATION:   Selected notes from the MEDICAL RECORD NUMBER Referred by Dr. Everardo Pacific for PVD and retinal tears   CURRENT MEDICATIONS: No current outpatient medications on file. (Ophthalmic Drugs)   No current facility-administered medications for this visit. (Ophthalmic Drugs)   No current outpatient medications on file. (Other)   No current facility-administered medications for this visit. (Other)      REVIEW OF SYSTEMS: ROS   Positive for: Eyes Negative for: Constitutional, Gastrointestinal, Neurological, Skin, Genitourinary, Musculoskeletal, HENT, Endocrine, Cardiovascular, Respiratory, Psychiatric, Allergic/Imm, Heme/Lymph Last edited by Corrinne Eagle on 04/05/2021  8:07 AM.          ALLERGIES No Known Allergies  PAST MEDICAL HISTORY Past Medical History:  Diagnosis Date   BACK PAIN 10/27/2008   BACK PAIN 10/27/2008   Qualifier: Diagnosis of  By: Amador Cunas  MD, Janett Labella    Cataract    Elevated transaminase level 02/08/2011   Gynecomastia, male 01/10/2016    GYNECOMASTIA, UNILATERAL 07/07/2008   Head injury, closed    HYPERLIPIDEMIA 10/23/2007   Hypogonadism in male 10/02/2015   OTALGIA 09/21/2007   POSITIVE TB SKIN TEST, WITHOUT TUBERCULOSIS 04/26/2008   PVCs (premature ventricular contractions) 05/06/2018   Testosterone deficiency    Past Surgical History:  Procedure Laterality Date   NO PAST SURGERIES      FAMILY HISTORY Family History  Problem Relation Age of Onset   Diabetes Father    Hypercholesterolemia Father    Colon cancer Neg Hx    Other Neg Hx        hypogonadism    SOCIAL HISTORY Social History   Tobacco Use   Smoking status: Never   Smokeless tobacco: Never  Substance Use Topics   Alcohol use: No   Drug use: No         OPHTHALMIC EXAM:  Base Eye Exam     Visual Acuity (Snellen - Linear)       Right Left   Dist cc 20/20 -1 20/20    Correction: Glasses         Tonometry (Tonopen, 8:12 AM)       Right Left   Pressure 20 18         Pupils       Dark Light Shape React APD   Right 4 3 Round Brisk 0   Left 4 3 Round Brisk 0  Visual Fields       Left Right    Full Full         Extraocular Movement       Right Left    Full Full         Neuro/Psych     Oriented x3: Yes   Mood/Affect: Normal         Dilation     Both eyes: 1.0% Mydriacyl, 2.5% Phenylephrine @ 8:13 AM           Slit Lamp and Fundus Exam     Slit Lamp Exam       Right Left   Lids/Lashes Dermatochalasis - upper lid Dermatochalasis - upper lid   Conjunctiva/Sclera mild melanosis mild melanosis   Cornea 1+ inferior PEE, trace tear film debris, mild arcus 1+ inferior PEE, trace tear film debris, mild arcus   Anterior Chamber deep, clear, narrow temporal angle deep, clear, narrow temporal angle   Iris Round and dilated Round and dilated   Lens 2-3+ Nuclear sclerosis, 2-3+ Cortical cataract 2-3+ Nuclear sclerosis, 2-3+ Cortical cataract   Vitreous Vitreous syneresis, Posterior vitreous  detachment, Weiss ring, vitreous condensations improved Vitreous syneresis         Fundus Exam       Right Left   Disc Pink and Sharp, temporal PPA, mild tilt Pink and Sharp, temporal PPP, tilted   C/D Ratio 0.4 0.6   Macula Flat, Blunted foveal reflex, RPE mottling and clumping, No heme or edema Flat, Blunted foveal reflex, mild RPE mottling and clumping, No heme or edema   Vessels mild attenuation, mild tortuousity, mild AV crossing changes mild attenuation, mild tortuousity   Periphery Attached, small retinal break at 0600 with mild pigment changes -- now operculated -- good laser changes with; no SRF, mild prigmented cystoid degeneration, no new RT/RD Attached, pigmented cystoid degeneration, round hole at 0430 (almost to ora) within bed of pigmented cystoid degeneration -- good laser surrounding, no new RT/RD            IMAGING AND PROCEDURES  Imaging and Procedures for 04/05/2021  OCT, Retina - OU - Both Eyes       Right Eye Quality was good. Central Foveal Thickness: 240. Progression has been stable. Findings include normal foveal contour, no IRF, no SRF, myopic contour (Interval improvement in vitreous opacities).   Left Eye Quality was good. Central Foveal Thickness: 236. Progression has been stable. Findings include normal foveal contour, no IRF, no SRF, myopic contour (Partial PVD).   Notes *Images captured and stored on drive  Diagnosis / Impression:  NFP; no IRF/SRF OU Myopic contour OU  Clinical management:  See below  Abbreviations: NFP - Normal foveal profile. CME - cystoid macular edema. PED - pigment epithelial detachment. IRF - intraretinal fluid. SRF - subretinal fluid. EZ - ellipsoid zone. ERM - epiretinal membrane. ORA - outer retinal atrophy. ORT - outer retinal tubulation. SRHM - subretinal hyper-reflective material. IRHM - intraretinal hyper-reflective material                 ASSESSMENT/PLAN:    ICD-10-CM   1. Retinal tear of both eyes   H33.313     2. Posterior vitreous detachment of right eye  H43.811     3. Retinal edema  H35.81 OCT, Retina - OU - Both Eyes    4. Combined forms of age-related cataract of both eyes  H25.813     5. High myopia, both eyes  H52.13  1,2. Retinal tear, OU  - subacute PVD OD  - onset of hairlike floater -- improved, presented to Dr. Allena Katz 06.29.22 - OD: small retinal break 0600 -- now operculated  - OS: round hole at 0430 ora - s/p laser retinopexy OD (07.05.22) -- good laser changes - s/p laser retinopexy OS (07.14.22) -- good laser changes - f/u 3 months, DFE, OCT  3. No retinal edema on exam on OCT  4. Mixed Cataract OU - The symptoms of cataract, surgical options, and treatments and risks were discussed with patient. - discussed diagnosis and progression - not yet visually significant - monitor for now  5. High myopia w/ astigmatism OU - discussed association of high myopia with lattice degeneration and increased risk of RT/RD   Ophthalmic Meds Ordered this visit:  No orders of the defined types were placed in this encounter.      Return in about 3 months (around 07/06/2021) for f/u retinal tear OU, DFE, OCT.  There are no Patient Instructions on file for this visit.   Explained the diagnoses, plan, and follow up with the patient and they expressed understanding.  Patient expressed understanding of the importance of proper follow up care.   This document serves as a record of services personally performed by Karie Chimera, MD, PhD. It was created on their behalf by Joni Reining, an ophthalmic technician. The creation of this record is the provider's dictation and/or activities during the visit.    Electronically signed by: Joni Reining COA, 04/05/21  10:50 AM  This document serves as a record of services personally performed by Karie Chimera, MD, PhD. It was created on their behalf by Glee Arvin. Manson Passey, OA an ophthalmic technician. The creation of this record  is the provider's dictation and/or activities during the visit.    Electronically signed by: Glee Arvin. Manson Passey, [place] 08.04.2022 10:50 AM  Karie Chimera, M.D., Ph.D. Diseases & Surgery of the Retina and Vitreous [facility] 04/05/2021  I have reviewed the above documentation for accuracy and completeness, and I agree with the above. Karie Chimera, M.D., Ph.D. 04/05/21 10:50 AM   Abbreviations: M myopia (nearsighted); A astigmatism; H hyperopia (farsighted); P presbyopia; Mrx spectacle prescription;  CTL contact lenses; OD right eye; OS left eye; OU both eyes  XT exotropia; ET esotropia; PEK punctate epithelial keratitis; PEE punctate epithelial erosions; DES dry eye syndrome; MGD meibomian gland dysfunction; ATs artificial tears; PFAT's preservative free artificial tears; NSC nuclear sclerotic cataract; PSC posterior subcapsular cataract; ERM epi-retinal membrane; PVD posterior vitreous detachment; RD retinal detachment; DM diabetes mellitus; DR diabetic retinopathy; NPDR non-proliferative diabetic retinopathy; PDR proliferative diabetic retinopathy; CSME clinically significant macular edema; DME diabetic macular edema; dbh dot blot hemorrhages; CWS cotton wool spot; POAG primary open angle glaucoma; C/D cup-to-disc ratio; HVF humphrey visual field; GVF goldmann visual field; OCT optical coherence tomography; IOP intraocular pressure; BRVO Branch retinal vein occlusion; CRVO central retinal vein occlusion; CRAO central retinal artery occlusion; BRAO branch retinal artery occlusion; RT retinal tear; SB scleral buckle; PPV pars plana vitrectomy; VH Vitreous hemorrhage; PRP panretinal laser photocoagulation; IVK intravitreal kenalog; VMT vitreomacular traction; MH Macular hole;  NVD neovascularization of the disc; NVE neovascularization elsewhere; AREDS age related eye disease study; ARMD age related macular degeneration; POAG primary open angle glaucoma; EBMD epithelial/anterior  basement membrane dystrophy; ACIOL anterior chamber intraocular lens; IOL intraocular lens; PCIOL posterior chamber intraocular lens; Phaco/IOL phacoemulsification with intraocular lens placement; PRK photorefractive keratectomy; LASIK laser assisted  in situ keratomileusis; HTN hypertension; DM diabetes mellitus; COPD chronic obstructive pulmonary disease

## 2021-04-05 ENCOUNTER — Other Ambulatory Visit: Payer: Self-pay

## 2021-04-05 ENCOUNTER — Ambulatory Visit (INDEPENDENT_AMBULATORY_CARE_PROVIDER_SITE_OTHER): Payer: 59 | Admitting: Ophthalmology

## 2021-04-05 ENCOUNTER — Encounter (INDEPENDENT_AMBULATORY_CARE_PROVIDER_SITE_OTHER): Payer: Self-pay | Admitting: Ophthalmology

## 2021-04-05 DIAGNOSIS — H3581 Retinal edema: Secondary | ICD-10-CM | POA: Diagnosis not present

## 2021-04-05 DIAGNOSIS — H25813 Combined forms of age-related cataract, bilateral: Secondary | ICD-10-CM | POA: Diagnosis not present

## 2021-04-05 DIAGNOSIS — H43811 Vitreous degeneration, right eye: Secondary | ICD-10-CM

## 2021-04-05 DIAGNOSIS — H33313 Horseshoe tear of retina without detachment, bilateral: Secondary | ICD-10-CM

## 2021-04-05 DIAGNOSIS — H5213 Myopia, bilateral: Secondary | ICD-10-CM

## 2021-04-10 ENCOUNTER — Encounter (INDEPENDENT_AMBULATORY_CARE_PROVIDER_SITE_OTHER): Payer: Self-pay | Admitting: Ophthalmology

## 2021-07-03 NOTE — Progress Notes (Shared)
[facility] - Clinic Note  07/10/2021     CHIEF COMPLAINT Patient presents for No chief complaint on file.   HISTORY OF PRESENT ILLNESS: Derek Wong is a 60 y.o. male who presents to the clinic today for:     Referring physician: Ardith Dark, MD [address] [place],  [place] [address]  HISTORICAL INFORMATION:   Selected notes from the MEDICAL RECORD NUMBER Referred by Dr. Everardo Pacific for PVD and retinal tears   CURRENT MEDICATIONS: No current outpatient medications on file. (Ophthalmic Drugs)   No current facility-administered medications for this visit. (Ophthalmic Drugs)   No current outpatient medications on file. (Other)   No current facility-administered medications for this visit. (Other)      REVIEW OF SYSTEMS:        ALLERGIES No Known Allergies  PAST MEDICAL HISTORY Past Medical History:  Diagnosis Date   BACK PAIN 10/27/2008   BACK PAIN 10/27/2008   Qualifier: Diagnosis of  By: Amador Cunas  MD, Janett Labella    Cataract    Elevated transaminase level 02/08/2011   Gynecomastia, male 01/10/2016   GYNECOMASTIA, UNILATERAL 07/07/2008   Head injury, closed    HYPERLIPIDEMIA 10/23/2007   Hypogonadism in male 10/02/2015   OTALGIA 09/21/2007   POSITIVE TB SKIN TEST, WITHOUT TUBERCULOSIS 04/26/2008   PVCs (premature ventricular contractions) 05/06/2018   Testosterone deficiency    Past Surgical History:  Procedure Laterality Date   NO PAST SURGERIES      FAMILY HISTORY Family History  Problem Relation Age of Onset   Diabetes Father    Hypercholesterolemia Father    Colon cancer Neg Hx    Other Neg Hx        hypogonadism    SOCIAL HISTORY Social History   Tobacco Use   Smoking status: Never   Smokeless tobacco: Never  Substance Use Topics   Alcohol use: No   Drug use: No         OPHTHALMIC EXAM:  Not recorded     IMAGING AND PROCEDURES  Imaging and Procedures for 07/10/2021                ASSESSMENT/PLAN:  No diagnosis found.   1,2. Retinal tear, OU  - subacute PVD OD  - onset of hairlike floater -- improved, presented to Dr. Allena Katz 06.29.22 - OD: small retinal break 0600 -- now operculated  - OS: round hole at 0430 ora - s/p laser retinopexy OD (07.05.22) -- good laser changes - s/p laser retinopexy OS (07.14.22) -- good laser changes - f/u 3 months, DFE, OCT  3. No retinal edema on exam on OCT  4. Mixed Cataract OU - The symptoms of cataract, surgical options, and treatments and risks were discussed with patient. - discussed diagnosis and progression - not yet visually significant - monitor for now  5. High myopia w/ astigmatism OU - discussed association of high myopia with lattice degeneration and increased risk of RT/RD   Ophthalmic Meds Ordered this visit:  No orders of the defined types were placed in this encounter.      No follow-ups on file.  There are no Patient Instructions on file for this visit.   Explained the diagnoses, plan, and follow up with the patient and they expressed understanding.  Patient expressed understanding of the importance of proper follow up care.   This document serves as a record of services personally performed by Karie Chimera, MD, PhD. It was created on  their behalf by Cristopher Estimable, COT an ophthalmic technician. The creation of this record is the provider's dictation and/or activities during the visit.    Electronically signed by: Cristopher Estimable, COT 11.1.22 @ 10:47 AM   Karie Chimera, M.D., Ph.D. Diseases & Surgery of the Retina and Vitreous [facility]    Abbreviations: M myopia (nearsighted); A astigmatism; H hyperopia (farsighted); P presbyopia; Mrx spectacle prescription;  CTL contact lenses; OD right eye; OS left eye; OU both eyes  XT exotropia; ET esotropia; PEK punctate epithelial keratitis; PEE punctate epithelial erosions; DES dry eye syndrome; MGD meibomian gland  dysfunction; ATs artificial tears; PFAT's preservative free artificial tears; NSC nuclear sclerotic cataract; PSC posterior subcapsular cataract; ERM epi-retinal membrane; PVD posterior vitreous detachment; RD retinal detachment; DM diabetes mellitus; DR diabetic retinopathy; NPDR non-proliferative diabetic retinopathy; PDR proliferative diabetic retinopathy; CSME clinically significant macular edema; DME diabetic macular edema; dbh dot blot hemorrhages; CWS cotton wool spot; POAG primary open angle glaucoma; C/D cup-to-disc ratio; HVF humphrey visual field; GVF goldmann visual field; OCT optical coherence tomography; IOP intraocular pressure; BRVO Branch retinal vein occlusion; CRVO central retinal vein occlusion; CRAO central retinal artery occlusion; BRAO branch retinal artery occlusion; RT retinal tear; SB scleral buckle; PPV pars plana vitrectomy; VH Vitreous hemorrhage; PRP panretinal laser photocoagulation; IVK intravitreal kenalog; VMT vitreomacular traction; MH Macular hole;  NVD neovascularization of the disc; NVE neovascularization elsewhere; AREDS age related eye disease study; ARMD age related macular degeneration; POAG primary open angle glaucoma; EBMD epithelial/anterior basement membrane dystrophy; ACIOL anterior chamber intraocular lens; IOL intraocular lens; PCIOL posterior chamber intraocular lens; Phaco/IOL phacoemulsification with intraocular lens placement; PRK photorefractive keratectomy; LASIK laser assisted in situ keratomileusis; HTN hypertension; DM diabetes mellitus; COPD chronic obstructive pulmonary disease

## 2021-07-10 ENCOUNTER — Encounter (INDEPENDENT_AMBULATORY_CARE_PROVIDER_SITE_OTHER): Payer: 59 | Admitting: Ophthalmology

## 2021-07-10 DIAGNOSIS — H33313 Horseshoe tear of retina without detachment, bilateral: Secondary | ICD-10-CM

## 2021-07-10 DIAGNOSIS — H3581 Retinal edema: Secondary | ICD-10-CM

## 2021-07-10 DIAGNOSIS — H43811 Vitreous degeneration, right eye: Secondary | ICD-10-CM

## 2021-07-10 DIAGNOSIS — H25813 Combined forms of age-related cataract, bilateral: Secondary | ICD-10-CM

## 2021-07-10 DIAGNOSIS — H5213 Myopia, bilateral: Secondary | ICD-10-CM

## 2021-07-11 NOTE — Progress Notes (Addendum)
[facility] - Clinic Note  07/12/2021     CHIEF COMPLAINT Patient presents for Retina Follow Up   HISTORY OF PRESENT ILLNESS: Derek Wong is a 60 y.o. male who presents to the clinic today for:   HPI     Retina Follow Up   Patient presents with  Retinal Break/Detachment.  In both eyes.  Duration of 3 months.  Since onset it is stable.  I, the attending physician,  performed the HPI with the patient and updated documentation appropriately.        Comments   3 month follow up Retinal Tear OU-  No new changes since last visit.  Denies FOLs.       Last edited by Rennis Chris, MD on 07/12/2021 11:43 PM.     Pt states he is not seeing any fol, but still sees occasional hair-like floaters  Referring physician: Everardo Pacific, OD [address] [place],  [place] [address]  HISTORICAL INFORMATION:   Selected notes from the MEDICAL RECORD NUMBER Referred by Dr. Everardo Pacific for PVD and retinal tears   CURRENT MEDICATIONS: No current outpatient medications on file. (Ophthalmic Drugs)   No current facility-administered medications for this visit. (Ophthalmic Drugs)   No current outpatient medications on file. (Other)   No current facility-administered medications for this visit. (Other)   REVIEW OF SYSTEMS: ROS   Positive for: Eyes Negative for: Constitutional, Gastrointestinal, Neurological, Skin, Genitourinary, Musculoskeletal, HENT, Endocrine, Cardiovascular, Respiratory, Psychiatric, Allergic/Imm, Heme/Lymph Last edited by Joni Reining, COA on 07/12/2021  8:25 AM.     ALLERGIES No Known Allergies  PAST MEDICAL HISTORY Past Medical History:  Diagnosis Date   BACK PAIN 10/27/2008   BACK PAIN 10/27/2008   Qualifier: Diagnosis of  By: Amador Cunas  MD, Janett Labella    Cataract    Elevated transaminase level 02/08/2011   Gynecomastia, male 01/10/2016   GYNECOMASTIA, UNILATERAL 07/07/2008   Head injury, closed    HYPERLIPIDEMIA  10/23/2007   Hypogonadism in male 10/02/2015   OTALGIA 09/21/2007   POSITIVE TB SKIN TEST, WITHOUT TUBERCULOSIS 04/26/2008   PVCs (premature ventricular contractions) 05/06/2018   Testosterone deficiency    Past Surgical History:  Procedure Laterality Date   NO PAST SURGERIES      FAMILY HISTORY Family History  Problem Relation Age of Onset   Diabetes Father    Hypercholesterolemia Father    Colon cancer Neg Hx    Other Neg Hx        hypogonadism    SOCIAL HISTORY Social History   Tobacco Use   Smoking status: Never   Smokeless tobacco: Never  Substance Use Topics   Alcohol use: No   Drug use: No       OPHTHALMIC EXAM: Base Eye Exam     Visual Acuity (Snellen - Linear)       Right Left   Dist cc 20/20 20/20         Tonometry (Tonopen, 8:29 AM)       Right Left   Pressure 17 16         Pupils       Dark Light Shape React APD   Right 5 4 Round Brisk None   Left 5 4 Round Brisk None         Visual Fields (Counting fingers)       Left Right    Full Full         Extraocular Movement  Right Left    Full Full         Neuro/Psych     Oriented x3: Yes   Mood/Affect: Normal         Dilation     Both eyes: 1.0% Mydriacyl, 2.5% Phenylephrine @ 8:30 AM           Slit Lamp and Fundus Exam     Slit Lamp Exam       Right Left   Lids/Lashes Dermatochalasis - upper lid Dermatochalasis - upper lid   Conjunctiva/Sclera mild melanosis mild melanosis   Cornea 1+ inferior PEE, trace tear film debris, mild arcus 1+ inferior PEE, trace tear film debris, mild arcus   Anterior Chamber deep, clear, narrow temporal angle deep, clear, narrow temporal angle   Iris Round and dilated Round and dilated   Lens 2-3+ Nuclear sclerosis, 2-3+ Cortical cataract 2-3+ Nuclear sclerosis, 2-3+ Cortical cataract   Vitreous Vitreous syneresis, Posterior vitreous detachment, Weiss ring, vitreous condensations Vitreous syneresis         Fundus Exam        Right Left   Disc Pink and Sharp, temporal PPA, mild tilt Pink and Sharp, temporal PPP, tilted   C/D Ratio 0.4 0.6   Macula Flat, Blunted foveal reflex, RPE mottling and clumping, No heme or edema Flat, Blunted foveal reflex, mild RPE mottling and clumping, No heme or edema   Vessels mild attenuation, mild tortuousity, mild AV crossing changes mild attenuation, mild tortuousity   Periphery Attached, small retinal break at 0600 with mild pigment changes -- now operculated -- good laser changes with; no SRF, mild prigmented cystoid degeneration, no new RT/RD Attached, pigmented cystoid degeneration, round hole at 0430 (almost to ora) within bed of pigmented cystoid degeneration -- good laser surrounding, no new RT/RD           Refraction     Wearing Rx       Sphere Cylinder Axis Add   Right -4.25 +1.25 080 +2.50   Left -4.25 +1.25 177 +2.50           IMAGING AND PROCEDURES  Imaging and Procedures for 07/12/2021  OCT, Retina - OU - Both Eyes       Right Eye Quality was good. Central Foveal Thickness: 236. Progression has been stable. Findings include normal foveal contour, no IRF, no SRF, myopic contour (Stable improvement in vitreous opacities).   Left Eye Quality was good. Central Foveal Thickness: 233. Progression has been stable. Findings include normal foveal contour, no IRF, no SRF, myopic contour (Partial PVD).   Notes *Images captured and stored on drive  Diagnosis / Impression:  NFP; no IRF/SRF OU Myopic contour OU  Clinical management:  See below  Abbreviations: NFP - Normal foveal profile. CME - cystoid macular edema. PED - pigment epithelial detachment. IRF - intraretinal fluid. SRF - subretinal fluid. EZ - ellipsoid zone. ERM - epiretinal membrane. ORA - outer retinal atrophy. ORT - outer retinal tubulation. SRHM - subretinal hyper-reflective material. IRHM - intraretinal hyper-reflective material            ASSESSMENT/PLAN:    ICD-10-CM   1.  Retinal tear of both eyes  H33.313     2. Posterior vitreous detachment of right eye  H43.811     3. Retinal edema  H35.81 OCT, Retina - OU - Both Eyes    4. Combined forms of age-related cataract of both eyes  H25.813     5. High myopia, both eyes  H52.13  1-3. Retinal tear, OU  - subacute PVD OD  - onset of hairlike floater -- improved, presented to Dr. Allena Katz 06.29.22 - OD: small retinal break 0600 -- now operculated  - OS: round hole at 0430 ora - s/p laser retinopexy OD (07.05.22) -- good laser changes - s/p laser retinopexy OS (07.14.22) -- good laser changes - no new RT/RD OU - pt is cleared from a retina standpoint for release back to [facility] and resumption of primary eye care  4. Mixed Cataract OU - The symptoms of cataract, surgical options, and treatments and risks were discussed with patient. - discussed diagnosis and progression - not yet visually significant - monitor for now  5. High myopia w/ astigmatism OU - discussed association of high myopia with lattice degeneration and increased risk of RT/RD   Ophthalmic Meds Ordered this visit:  No orders of the defined types were placed in this encounter.      Return if symptoms worsen or fail to improve.  There are no Patient Instructions on file for this visit.   Explained the diagnoses, plan, and follow up with the patient and they expressed understanding.  Patient expressed understanding of the importance of proper follow up care.   This document serves as a record of services personally performed by Karie Chimera, MD, PhD. It was created on their behalf by Glee Arvin. Manson Passey, OA an ophthalmic technician. The creation of this record is the provider's dictation and/or activities during the visit.    Electronically signed by: Glee Arvin. Manson Passey, [place] 11.09.2022 12:02 AM   Karie Chimera, M.D., Ph.D. Diseases & Surgery of the Retina and Vitreous [facility]  I have  reviewed the above documentation for accuracy and completeness, and I agree with the above. Karie Chimera, M.D., Ph.D. 07/13/21 12:02 AM   Abbreviations: M myopia (nearsighted); A astigmatism; H hyperopia (farsighted); P presbyopia; Mrx spectacle prescription;  CTL contact lenses; OD right eye; OS left eye; OU both eyes  XT exotropia; ET esotropia; PEK punctate epithelial keratitis; PEE punctate epithelial erosions; DES dry eye syndrome; MGD meibomian gland dysfunction; ATs artificial tears; PFAT's preservative free artificial tears; NSC nuclear sclerotic cataract; PSC posterior subcapsular cataract; ERM epi-retinal membrane; PVD posterior vitreous detachment; RD retinal detachment; DM diabetes mellitus; DR diabetic retinopathy; NPDR non-proliferative diabetic retinopathy; PDR proliferative diabetic retinopathy; CSME clinically significant macular edema; DME diabetic macular edema; dbh dot blot hemorrhages; CWS cotton wool spot; POAG primary open angle glaucoma; C/D cup-to-disc ratio; HVF humphrey visual field; GVF goldmann visual field; OCT optical coherence tomography; IOP intraocular pressure; BRVO Branch retinal vein occlusion; CRVO central retinal vein occlusion; CRAO central retinal artery occlusion; BRAO branch retinal artery occlusion; RT retinal tear; SB scleral buckle; PPV pars plana vitrectomy; VH Vitreous hemorrhage; PRP panretinal laser photocoagulation; IVK intravitreal kenalog; VMT vitreomacular traction; MH Macular hole;  NVD neovascularization of the disc; NVE neovascularization elsewhere; AREDS age related eye disease study; ARMD age related macular degeneration; POAG primary open angle glaucoma; EBMD epithelial/anterior basement membrane dystrophy; ACIOL anterior chamber intraocular lens; IOL intraocular lens; PCIOL posterior chamber intraocular lens; Phaco/IOL phacoemulsification with intraocular lens placement; PRK photorefractive keratectomy; LASIK laser assisted in situ keratomileusis;  HTN hypertension; DM diabetes mellitus; COPD chronic obstructive pulmonary disease

## 2021-07-12 ENCOUNTER — Encounter (INDEPENDENT_AMBULATORY_CARE_PROVIDER_SITE_OTHER): Payer: Self-pay | Admitting: Ophthalmology

## 2021-07-12 ENCOUNTER — Other Ambulatory Visit: Payer: Self-pay

## 2021-07-12 ENCOUNTER — Ambulatory Visit (INDEPENDENT_AMBULATORY_CARE_PROVIDER_SITE_OTHER): Payer: 59 | Admitting: Ophthalmology

## 2021-07-12 DIAGNOSIS — H3581 Retinal edema: Secondary | ICD-10-CM

## 2021-07-12 DIAGNOSIS — H25813 Combined forms of age-related cataract, bilateral: Secondary | ICD-10-CM | POA: Diagnosis not present

## 2021-07-12 DIAGNOSIS — H33313 Horseshoe tear of retina without detachment, bilateral: Secondary | ICD-10-CM | POA: Diagnosis not present

## 2021-07-12 DIAGNOSIS — H5213 Myopia, bilateral: Secondary | ICD-10-CM

## 2021-07-12 DIAGNOSIS — H43811 Vitreous degeneration, right eye: Secondary | ICD-10-CM | POA: Diagnosis not present

## 2021-11-08 ENCOUNTER — Encounter: Payer: Self-pay | Admitting: Family Medicine

## 2021-11-08 ENCOUNTER — Ambulatory Visit (INDEPENDENT_AMBULATORY_CARE_PROVIDER_SITE_OTHER): Payer: 59 | Admitting: Family Medicine

## 2021-11-08 VITALS — BP 170/102 | HR 62 | Temp 98.0°F | Ht 67.0 in | Wt 165.6 lb

## 2021-11-08 DIAGNOSIS — R7303 Prediabetes: Secondary | ICD-10-CM | POA: Diagnosis not present

## 2021-11-08 DIAGNOSIS — I10 Essential (primary) hypertension: Secondary | ICD-10-CM | POA: Insufficient documentation

## 2021-11-08 DIAGNOSIS — Z0001 Encounter for general adult medical examination with abnormal findings: Secondary | ICD-10-CM | POA: Diagnosis not present

## 2021-11-08 DIAGNOSIS — E785 Hyperlipidemia, unspecified: Secondary | ICD-10-CM | POA: Diagnosis not present

## 2021-11-08 DIAGNOSIS — Z125 Encounter for screening for malignant neoplasm of prostate: Secondary | ICD-10-CM | POA: Diagnosis not present

## 2021-11-08 DIAGNOSIS — R030 Elevated blood-pressure reading, without diagnosis of hypertension: Secondary | ICD-10-CM

## 2021-11-08 DIAGNOSIS — Z7184 Encounter for health counseling related to travel: Secondary | ICD-10-CM

## 2021-11-08 LAB — COMPREHENSIVE METABOLIC PANEL
ALT: 16 U/L (ref 0–53)
AST: 20 U/L (ref 0–37)
Albumin: 4.6 g/dL (ref 3.5–5.2)
Alkaline Phosphatase: 66 U/L (ref 39–117)
BUN: 13 mg/dL (ref 6–23)
CO2: 30 mEq/L (ref 19–32)
Calcium: 9.5 mg/dL (ref 8.4–10.5)
Chloride: 103 mEq/L (ref 96–112)
Creatinine, Ser: 1.19 mg/dL (ref 0.40–1.50)
GFR: 65.5 mL/min (ref >60.00)
Glucose, Bld: 98 mg/dL (ref 70–99)
Potassium: 3.9 mEq/L (ref 3.5–5.1)
Sodium: 141 mEq/L (ref 135–145)
Total Bilirubin: 0.9 mg/dL (ref 0.2–1.2)
Total Protein: 7 g/dL (ref 6.0–8.3)

## 2021-11-08 LAB — LIPID PANEL
Cholesterol: 222 mg/dL — ABNORMAL HIGH (ref 0–200)
HDL: 49.4 mg/dL (ref >39.00)
LDL Cholesterol: 155 mg/dL — ABNORMAL HIGH (ref 0–99)
NonHDL: 172.75
Total CHOL/HDL Ratio: 4
Triglycerides: 90 mg/dL (ref 0.0–149.0)
VLDL: 18 mg/dL (ref 0.0–40.0)

## 2021-11-08 LAB — CBC
HCT: 40.6 % (ref 39.0–52.0)
Hemoglobin: 13.7 g/dL (ref 13.0–17.0)
MCHC: 33.8 g/dL (ref 30.0–36.0)
MCV: 87.3 fl (ref 78.0–100.0)
Platelets: 161 10*3/uL (ref 150.0–400.0)
RBC: 4.65 Mil/uL (ref 4.22–5.81)
RDW: 13.6 % (ref 11.5–15.5)
WBC: 4 10*3/uL (ref 4.0–10.5)

## 2021-11-08 LAB — TSH: TSH: 1.79 u[IU]/mL (ref 0.35–5.50)

## 2021-11-08 LAB — HEMOGLOBIN A1C: Hgb A1c MFr Bld: 5.4 % (ref 4.6–6.5)

## 2021-11-08 LAB — PSA: PSA: 0.38 ng/mL (ref 0.10–4.00)

## 2021-11-08 NOTE — Assessment & Plan Note (Signed)
Elevated today to 170/100.  His home readings are in the 130s over 80s.  We discussed blood pressure management.  He is very reluctant to start any blood pressure medications at this point.  He is aware of long-term risk of uncontrolled blood pressure including increased risk for heart attack and stroke.  We discussed lifestyle modifications.  He will continue to monitor at home.  He will let me know if persistently elevated. ?

## 2021-11-08 NOTE — Patient Instructions (Signed)
It was very nice to see you today! ? ?We will check blood work today. ? ?I will refer you to see the travel medicine clinic. ? ?Please keep an eye on your blood pressure and let me know if it is persistently elevated to 150/90 or higher. ? ?We will see you back in 1 year for your next physical.  Come back sooner if needed. ? ?Take care, ?Dr Jimmey Ralph ? ?PLEASE NOTE: ? ?If you had any lab tests please let us know if you have not heard back within a few days. You may see your results on mychart before we have a chance to review them but we will give you a call once they are reviewed by [place]. If we ordered any referrals today, please let us know if you have not heard from their office within the next week.  ? ?Please try these tips to maintain a healthy lifestyle: ? ?Eat at least 3 REAL meals and 1-2 snacks per day.  Aim for no more than 5 hours between eating.  If you eat breakfast, please do so within one hour of getting up.  ? ?Each meal should contain half fruits/vegetables, one quarter protein, and one quarter carbs (no bigger than a computer mouse) ? ?Cut down on sweet beverages. This includes juice, soda, and sweet tea.  ? ?Drink at least 1 glass of water with each meal and aim for at least 8 glasses per day ? ?Exercise at least 150 minutes every week.   ? ?Preventive Care 4-46 Years Old, Male ?Preventive care refers to lifestyle choices and visits with your health care provider that can promote health and wellness. Preventive care visits are also called wellness exams. ?What can I expect for my preventive care visit? ?Counseling ?During your preventive care visit, your health care provider may ask about your: ?Medical history, including: ?Past medical problems. ?Family medical history. ?Current health, including: ?Emotional well-being. ?Home life and relationship well-being. ?Sexual activity. ?Lifestyle, including: ?Alcohol, nicotine or tobacco, and drug use. ?Access to firearms. ?Diet, exercise, and sleep  habits. ?Safety issues such as seatbelt and bike helmet use. ?Sunscreen use. ?Work and work Astronomer. ?Physical exam ?Your health care provider will check your: ?Height and weight. These may be used to calculate your BMI (body mass index). BMI is a measurement that tells if you are at a healthy weight. ?Waist circumference. This measures the distance around your waistline. This measurement also tells if you are at a healthy weight and may help predict your risk of certain diseases, such as type 2 diabetes and high blood pressure. ?Heart rate and blood pressure. ?Body temperature. ?Skin for abnormal spots. ?What immunizations do I need? ?Vaccines are usually given at various ages, according to a schedule. Your health care provider will recommend vaccines for you based on your age, medical history, and lifestyle or other factors, such as travel or where you work. ?What tests do I need? ?Screening ?Your health care provider may recommend screening tests for certain conditions. This may include: ?Lipid and cholesterol levels. ?Diabetes screening. This is done by checking your blood sugar (glucose) after you have not eaten for a while (fasting). ?Hepatitis B test. ?Hepatitis C test. ?HIV (human immunodeficiency virus) test. ?STI (sexually transmitted infection) testing, if you are at risk. ?Lung cancer screening. ?Prostate cancer screening. ?Colorectal cancer screening. ?Talk with your health care provider about your test results, treatment options, and if necessary, the need for more tests. ?Follow these instructions at home: ?Eating and drinking ? ?  Eat a diet that includes fresh fruits and vegetables, whole grains, lean protein, and low-fat dairy products. ?Take vitamin and mineral supplements as recommended by your health care provider. ?Do not drink alcohol if your health care provider tells you not to drink. ?If you drink alcohol: ?Limit how much you have to 0-2 drinks a day. ?Know how much alcohol is in your  drink. In the [place] one drink equals one 12 oz bottle of beer (355 mL), one 5 oz glass of wine (148 mL), or one 1? oz glass of hard liquor (44 mL). ?Lifestyle ?Brush your teeth every morning and night with fluoride toothpaste. Floss one time each day. ?Exercise for at least 30 minutes 5 or more days each week. ?Do not use any products that contain nicotine or tobacco. These products include cigarettes, chewing tobacco, and vaping devices, such as e-cigarettes. If you need help quitting, ask your health care provider. ?Do not use drugs. ?If you are sexually active, practice safe sex. Use a condom or other form of protection to prevent STIs. ?Take aspirin only as told by your health care provider. Make sure that you understand how much to take and what form to take. Work with your health care provider to find out whether it is safe and beneficial for you to take aspirin daily. ?Find healthy ways to manage stress, such as: ?Meditation, yoga, or listening to music. ?Journaling. ?Talking to a trusted person. ?Spending time with friends and family. ?Minimize exposure to UV radiation to reduce your risk of skin cancer. ?Safety ?Always wear your seat belt while driving or riding in a vehicle. ?Do not drive: ?If you have been drinking alcohol. Do not ride with someone who has been drinking. ?When you are tired or distracted. ?While texting. ?If you have been using any mind-altering substances or drugs. ?Wear a helmet and other protective equipment during sports activities. ?If you have firearms in your house, make sure you follow all gun safety procedures. ?What's next? ?Go to your health care provider once a year for an annual wellness visit. ?Ask your health care provider how often you should have your eyes and teeth checked. ?Stay up to date on all vaccines. ?This information is not intended to replace advice given to you by your health care provider. Make sure you discuss any questions you have with your health care  provider. ?Document Revised: 02/14/2021 Document Reviewed: 02/14/2021 ?Elsevier Patient Education ? 2022 [employer] ? ?

## 2021-11-08 NOTE — Progress Notes (Signed)
? ?Chief Complaint:  ?Derek Wong is a 61 y.o. male who presents today for his annual comprehensive physical exam.   ? ?Assessment/Plan:  ?New/Acute Problems: ?Travel Advice ?Will be going to [place] later this month.  May benefit from malaria prophylaxis.  We will place referral to travel medicine clinic per patient request. ? ?Chronic Problems Addressed Today: ?Dyslipidemia ?Discussed lifestyle modifications. Check labs today.  ? ?Prediabetes ?Discussed lifestyle modifications. Check A1c.  ? ?Elevated blood pressure reading ?Elevated today to 170/100.  His home readings are in the 130s over 80s.  We discussed blood pressure management.  He is very reluctant to start any blood pressure medications at this point.  He is aware of long-term risk of uncontrolled blood pressure including increased risk for heart attack and stroke.  We discussed lifestyle modifications.  He will continue to monitor at home.  He will let me know if persistently elevated. ? ? ?Preventative Healthcare: ?Check labs. UTD on other vaccines and screenings.  ? ?Patient Counseling(The following topics were reviewed and/or handout was given): ? -Nutrition: Stressed importance of moderation in sodium/caffeine intake, saturated fat and cholesterol, caloric balance, sufficient intake of fresh fruits, vegetables, and fiber. ? -Stressed the importance of regular exercise.  ? -Substance Abuse: Discussed cessation/primary prevention of tobacco, alcohol, or other drug use; driving or other dangerous activities under the influence; availability of treatment for abuse.  ? -Injury prevention: Discussed safety belts, safety helmets, smoke detector, smoking near bedding or upholstery.  ? -Sexuality: Discussed sexually transmitted diseases, partner selection, use of condoms, avoidance of unintended pregnancy and contraceptive alternatives.  ? -Dental health: Discussed importance of regular tooth brushing, flossing, and dental visits. ? -Health maintenance  and immunizations reviewed. Please refer to Health maintenance section. ? ?Return to care in 1 year for next preventative visit.  ? ?  ?Subjective:  ?HPI: ? ?He has no acute complaints today.  ? ?Patient is traveling to his country [place] this month.  He staying there for a month. He would like to be up to date on the immunization. He has no other complaint today.  ? ?His blood pressure was elevated in the office today. He notes he does check his blood pressure at home. Usually 135/83 at home. He is following low sodium diet. No shortness of breath or headache.  ? ?Lifestyle ?Diet: None specific. ?Exercise: None specific. ? ?Depression screen [facility] 2/9 11/08/2021  ?Decreased Interest 0  ?Down, Depressed, Hopeless 0  ?PHQ - 2 Score 0  ? ?ROS: Per HPI, otherwise a complete review of systems was negative.  ? ?PMH: ? ?The following were reviewed and entered/updated in epic: ?Past Medical History:  ?Diagnosis Date  ? BACK PAIN 10/27/2008  ? BACK PAIN 10/27/2008  ? Qualifier: Diagnosis of  By: Amador Cunas  MD, Janett Labella   ? Cataract   ? Elevated transaminase level 02/08/2011  ? Gynecomastia, male 01/10/2016  ? GYNECOMASTIA, UNILATERAL 07/07/2008  ? Head injury, closed   ? HYPERLIPIDEMIA 10/23/2007  ? Hypogonadism in male 10/02/2015  ? OTALGIA 09/21/2007  ? 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 PVCs (premature ventricular contractions) 05/06/2018  ? Testosterone deficiency   ? ?Patient Active Problem List  ? Diagnosis Date Noted  ? Elevated blood pressure reading 11/08/2021  ? Prediabetes 09/11/2018  ? 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 Dyslipidemia 10/23/2007  ? ?Past Surgical History:  ?Procedure Laterality Date  ? NO PAST SURGERIES    ? ? ?Family History  ?Problem  Relation Age of Onset  ? Diabetes Father   ? Hypercholesterolemia Father   ? Colon cancer Neg Hx   ? Other Neg Hx   ?     hypogonadism  ? ? ?Medications- reviewed and updated ?No current outpatient medications on file.   ? ?No current facility-administered medications for this visit.  ? ? ?Allergies-reviewed and updated ?No Known Allergies ? ?Social History  ? ?Socioeconomic History  ? Marital status: Married  ?  Spouse name: Not on file  ? Number of children: Not on file  ? Years of education: Not on file  ? Highest education level: Not on file  ?Occupational History  ? Not on file  ?Tobacco Use  ? Smoking status: Never  ? Smokeless tobacco: Never  ?Substance and Sexual Activity  ? Alcohol use: No  ? Drug use: No  ? Sexual activity: Not on file  ?Other Topics Concern  ? Not on file  ?Social History Narrative  ? Not on file  ? ?Social Determinants of Health  ? ?Financial Resource Strain: Not on file  ?Food Insecurity: Not on file  ?Transportation Needs: Not on file  ?Physical Activity: Not on file  ?Stress: Not on file  ?Social Connections: Not on file  ? ?   ?  ?Objective:  ?Physical Exam: ?BP (!) 170/102   Pulse 62   Temp 98 ?F (36.7 ?C) (Temporal)   Ht 5\' 7"  (1.702 m)   Wt 165 lb 9.6 oz (75.1 kg)   SpO2 100%   BMI 25.94 kg/m?   ?Body mass index is 25.94 kg/m?. ?Wt Readings from Last 3 Encounters:  ?11/08/21 165 lb 9.6 oz (75.1 kg)  ?11/07/20 172 lb 3.2 oz (78.1 kg)  ?08/18/20 169 lb 12.8 oz (77 kg)  ? ?Gen: NAD, resting comfortably ?HEENT: TMs normal bilaterally. OP clear. No thyromegaly noted.  ?CV: RRR with no murmurs appreciated ?Pulm: NWOB, CTAB with no crackles, wheezes, or rhonchi ?GI: Normal bowel sounds present. Soft, Nontender, Nondistended. ?MSK: no edema, cyanosis, or clubbing noted ?Skin: warm, dry ?Neuro: CN2-12 grossly intact. Strength 5/5 in upper and lower extremities. Reflexes symmetric and intact bilaterally.  ?Psych: Normal affect and thought content ?   ? ? ?I,Savera Zaman,acting as a 08/20/20 for Neurosurgeon, MD.,have documented all relevant documentation on the behalf of Jacquiline Doe, MD,as directed by  Jacquiline Doe, MD while in the presence of Jacquiline Doe, MD.  ? ?I, Jacquiline Doe, MD, have reviewed  all documentation for this visit. The documentation on 11/08/21 for the exam, diagnosis, procedures, and orders are all accurate and complete. ? ?01/08/22. Katina Degree, MD ?11/08/2021 8:35 AM  ? ?

## 2021-11-08 NOTE — Assessment & Plan Note (Addendum)
Discussed lifestyle modifications. Check A1c.  

## 2021-11-08 NOTE — Assessment & Plan Note (Signed)
Discussed lifestyle modifications.  Check labs today.  

## 2021-11-09 NOTE — Progress Notes (Signed)
Please inform patient of the following: ? ?Cholesterol is little bit elevated but better than last year.  He should continue to work on diet and exercise we could start a cholesterol medication to improve numbers and lower risk of heart attack and stroke.  He should let us know if it like to start this.  He should continue to work on diet and exercise and we can recheck in a year.

## 2022-05-27 ENCOUNTER — Encounter: Payer: Self-pay | Admitting: *Deleted

## 2022-08-15 ENCOUNTER — Encounter: Payer: Self-pay | Admitting: *Deleted

## 2022-10-21 ENCOUNTER — Ambulatory Visit (INDEPENDENT_AMBULATORY_CARE_PROVIDER_SITE_OTHER): Payer: 59 | Admitting: Family Medicine

## 2022-10-21 ENCOUNTER — Encounter: Payer: Self-pay | Admitting: Family Medicine

## 2022-10-21 VITALS — BP 184/103 | HR 64 | Temp 97.5°F | Ht 67.0 in | Wt 174.6 lb

## 2022-10-21 DIAGNOSIS — I10 Essential (primary) hypertension: Secondary | ICD-10-CM | POA: Diagnosis not present

## 2022-10-21 MED ORDER — AMLODIPINE BESYLATE 5 MG PO TABS: 5.0000 mg | ORAL_TABLET | Freq: Every day | ORAL | 3 refills | Status: DC

## 2022-10-21 NOTE — Progress Notes (Signed)
   Derek Wong is a 62 y.o. male who presents today for an office visit.  Assessment/Plan:  New/Acute Problems: Dizziness / Headache No symptoms currently.  Reassuring exam without any deficits.  His intermittent symptoms are likely secondary to uncontrolled hypertension.  Blood pressure initially 193/98 today and 184/103 on recheck.  See below procedure note.  If his symptoms persist despite adequate treatment of hypertension will need to look for other possible etiologies including imaging or possible referral to neurology.  We discussed warning signs and reasons to return to care.  Chronic Problems Addressed Today: Essential hypertension Not controlled today.  Initially elevated to 193/98 and was 184/103 on recheck.  Home readings have been elevated as well with home readings averaging systolics in the Q000111Q.  He is extremely reluctant to start any antihypertensives at this point.  We did have a lengthy discussion regarding importance of adequate blood pressure control not only to manage his current symptoms including dizziness and headache but also to lower his risk of heart attack and stroke going forward.  He is agreeable to try antihypertensive for a couple of weeks to see how he reacts to it.  We will start amlodipine 5 mg daily.  We did discuss potential side effects.  He will follow-up with me in 1 to 2 weeks.     Subjective:  HPI:  See A/p for status of chronic conditions.  Main concern today is dizziness. This has been going on for 2 weeks. Feels off balance. No obvious triggers or precipitating events. No room spinnng sensations. Feels like he is going to fall. Symptoms comes and goes and go. Also has some pain in his head. This started a couple of weeks. No weakness or numbness. No tingling. No vision changes. Nothing like this before. He has been checking his blood pressure at home and recently he has been averaging in systolics in the Q000111Q. Does not currently have headache  or dizziness.        Objective:  Physical Exam: BP (!) 184/103   Pulse 64   Temp (!) 97.5 F (36.4 C) (Temporal)   Ht 5' 7"$  (1.702 m)   Wt 174 lb 9.6 oz (79.2 kg)   SpO2 99%   BMI 27.35 kg/m   Gen: No acute distress, resting comfortably HEENT: No papilledema noted on funduscopic exam. CV: Regular rate and rhythm with no murmurs appreciated Pulm: Normal work of breathing, clear to auscultation bilaterally with no crackles, wheezes, or rhonchi Neuro: Current nerves II through XII intact.  Strength 5 out of 5 in upper and lower extremities.  Sensation light touch intact throughout. Psych: Normal affect and thought content      Derek Wong M. Jerline Pain, MD 10/21/2022 2:52 PM

## 2022-10-21 NOTE — Assessment & Plan Note (Signed)
Not controlled today.  Initially elevated to 193/98 and was 184/103 on recheck.  Home readings have been elevated as well with home readings averaging systolics in the Q000111Q.  He is extremely reluctant to start any antihypertensives at this point.  We did have a lengthy discussion regarding importance of adequate blood pressure control not only to manage his current symptoms including dizziness and headache but also to lower his risk of heart attack and stroke going forward.  He is agreeable to try antihypertensive for a couple of weeks to see how he reacts to it.  We will start amlodipine 5 mg daily.  We did discuss potential side effects.  He will follow-up with me in 1 to 2 weeks.

## 2022-10-21 NOTE — Patient Instructions (Signed)
It was very nice to see you today!  Your blood pressure is very elevated.  I believe this is causing both your headaches and dizzy spells.  If we can get your blood pressure lower this will help with your symptoms and also reduce your future risk for heart attack or stroke.    Please start the amlodipine 5 mg daily.  Please continue the great work on your diet and exercise.  Please follow-up with me in 1 to 2 weeks to let me know how your blood pressure is looking and how your symptoms are progressing.  Take care, Dr Jerline Pain  PLEASE NOTE:  If you had any lab tests, please let us know if you have not heard back within a few days. You may see your results on mychart before we have a chance to review them but we will give you a call once they are reviewed by [place].   If we ordered any referrals today, please let us know if you have not heard from their office within the next week.   If you had any urgent prescriptions sent in today, please check with the pharmacy within an hour of our visit to make sure the prescription was transmitted appropriately.   Please try these tips to maintain a healthy lifestyle:  Eat at least 3 REAL meals and 1-2 snacks per day.  Aim for no more than 5 hours between eating.  If you eat breakfast, please do so within one hour of getting up.   Each meal should contain half fruits/vegetables, one quarter protein, and one quarter carbs (no bigger than a computer mouse)  Cut down on sweet beverages. This includes juice, soda, and sweet tea.   Drink at least 1 glass of water with each meal and aim for at least 8 glasses per day  Exercise at least 150 minutes every week.

## 2022-11-04 ENCOUNTER — Encounter: Payer: Self-pay | Admitting: Pharmacist

## 2022-11-11 ENCOUNTER — Encounter: Payer: Self-pay | Admitting: Family Medicine

## 2022-11-11 ENCOUNTER — Ambulatory Visit (INDEPENDENT_AMBULATORY_CARE_PROVIDER_SITE_OTHER): Payer: 59 | Admitting: Family Medicine

## 2022-11-11 VITALS — BP 137/78 | HR 73 | Temp 97.5°F | Ht 67.0 in | Wt 172.2 lb

## 2022-11-11 DIAGNOSIS — Z1211 Encounter for screening for malignant neoplasm of colon: Secondary | ICD-10-CM

## 2022-11-11 DIAGNOSIS — I10 Essential (primary) hypertension: Secondary | ICD-10-CM

## 2022-11-11 DIAGNOSIS — Z125 Encounter for screening for malignant neoplasm of prostate: Secondary | ICD-10-CM | POA: Diagnosis not present

## 2022-11-11 DIAGNOSIS — R7303 Prediabetes: Secondary | ICD-10-CM | POA: Diagnosis not present

## 2022-11-11 DIAGNOSIS — Z114 Encounter for screening for human immunodeficiency virus [HIV]: Secondary | ICD-10-CM

## 2022-11-11 DIAGNOSIS — Z0001 Encounter for general adult medical examination with abnormal findings: Secondary | ICD-10-CM | POA: Diagnosis not present

## 2022-11-11 DIAGNOSIS — E785 Hyperlipidemia, unspecified: Secondary | ICD-10-CM | POA: Diagnosis not present

## 2022-11-11 LAB — COMPREHENSIVE METABOLIC PANEL
ALT: 14 U/L (ref 0–53)
AST: 17 U/L (ref 0–37)
Albumin: 4.2 g/dL (ref 3.5–5.2)
Alkaline Phosphatase: 59 U/L (ref 39–117)
BUN: 13 mg/dL (ref 6–23)
CO2: 29 mEq/L (ref 19–32)
Calcium: 9.7 mg/dL (ref 8.4–10.5)
Chloride: 103 mEq/L (ref 96–112)
Creatinine, Ser: 1.31 mg/dL (ref 0.40–1.50)
GFR: 57.95 mL/min — ABNORMAL LOW (ref >60.00)
Glucose, Bld: 115 mg/dL — ABNORMAL HIGH (ref 70–99)
Potassium: 4.3 mEq/L (ref 3.5–5.1)
Sodium: 140 mEq/L (ref 135–145)
Total Bilirubin: 0.6 mg/dL (ref 0.2–1.2)
Total Protein: 6.9 g/dL (ref 6.0–8.3)

## 2022-11-11 LAB — CBC
HCT: 40.2 % (ref 39.0–52.0)
Hemoglobin: 13.6 g/dL (ref 13.0–17.0)
MCHC: 34 g/dL (ref 30.0–36.0)
MCV: 86.7 fl (ref 78.0–100.0)
Platelets: 194 10*3/uL (ref 150.0–400.0)
RBC: 4.63 Mil/uL (ref 4.22–5.81)
RDW: 13.2 % (ref 11.5–15.5)
WBC: 3.6 10*3/uL — ABNORMAL LOW (ref 4.0–10.5)

## 2022-11-11 LAB — PSA: PSA: 0.55 ng/mL (ref 0.10–4.00)

## 2022-11-11 LAB — LIPID PANEL
Cholesterol: 221 mg/dL — ABNORMAL HIGH (ref 0–200)
HDL: 42.5 mg/dL (ref >39.00)
LDL Cholesterol: 162 mg/dL — ABNORMAL HIGH (ref 0–99)
NonHDL: 178.24
Total CHOL/HDL Ratio: 5
Triglycerides: 82 mg/dL (ref 0.0–149.0)
VLDL: 16.4 mg/dL (ref 0.0–40.0)

## 2022-11-11 LAB — HEMOGLOBIN A1C: Hgb A1c MFr Bld: 5.6 % (ref 4.6–6.5)

## 2022-11-11 LAB — TSH: TSH: 2.11 u[IU]/mL (ref 0.35–5.50)

## 2022-11-11 NOTE — Assessment & Plan Note (Signed)
Check labs.  Discussed lifestyle modifications. 

## 2022-11-11 NOTE — Assessment & Plan Note (Signed)
Blood pressure much better controlled today at 137/78.  He has been doing well with amlodipine 5 mg daily.  No significant side effects.  His headache and dizziness that he was experiencing couple weeks has subsided as well.  He would like to come off medications or wean down if at all possible.  He will continue with current dose for at least 4 more weeks.  Advised patient to go to half a dose instead of discontinuing and check in with me in a few weeks if he wishes to wean off. We discussed lifestyle modifications.  Discussed the importance of good blood pressure control and that we should have his home readings in the 130s over 80s or lower.  He voiced understanding.  He will follow-up with [place] in a few weeks via MyChart.

## 2022-11-11 NOTE — Assessment & Plan Note (Signed)
Check labs 

## 2022-11-11 NOTE — Patient Instructions (Signed)
It was very nice to see you today!  I am glad that you are doing better.   Please continue monitor your blood pressure at home.  We will check blood work today.  We will order Cologuard.  I will see you back in year your next physical.  Please come back sooner if needed.  Take care, Dr Jerline Pain  PLEASE NOTE:  If you had any lab tests, please let us know if you have not heard back within a few days. You may see your results on mychart before we have a chance to review them but we will give you a call once they are reviewed by [place].   If we ordered any referrals today, please let us know if you have not heard from their office within the next week.   If you had any urgent prescriptions sent in today, please check with the pharmacy within an hour of our visit to make sure the prescription was transmitted appropriately.   Please try these tips to maintain a healthy lifestyle:  Eat at least 3 REAL meals and 1-2 snacks per day.  Aim for no more than 5 hours between eating.  If you eat breakfast, please do so within one hour of getting up.   Each meal should contain half fruits/vegetables, one quarter protein, and one quarter carbs (no bigger than a computer mouse)  Cut down on sweet beverages. This includes juice, soda, and sweet tea.   Drink at least 1 glass of water with each meal and aim for at least 8 glasses per day  Exercise at least 150 minutes every week.    Preventive Care 28-68 Years Old, Male Preventive care refers to lifestyle choices and visits with your health care provider that can promote health and wellness. Preventive care visits are also called wellness exams. What can I expect for my preventive care visit? Counseling During your preventive care visit, your health care provider may ask about your: Medical history, including: Past medical problems. Family medical history. Current health, including: Emotional well-being. Home life and relationship  well-being. Sexual activity. Lifestyle, including: Alcohol, nicotine or tobacco, and drug use. Access to firearms. Diet, exercise, and sleep habits. Safety issues such as seatbelt and bike helmet use. Sunscreen use. Work and work Statistician. Physical exam Your health care provider will check your: Height and weight. These may be used to calculate your BMI (body mass index). BMI is a measurement that tells if you are at a healthy weight. Waist circumference. This measures the distance around your waistline. This measurement also tells if you are at a healthy weight and may help predict your risk of certain diseases, such as type 2 diabetes and high blood pressure. Heart rate and blood pressure. Body temperature. Skin for abnormal spots. What immunizations do I need?  Vaccines are usually given at various ages, according to a schedule. Your health care provider will recommend vaccines for you based on your age, medical history, and lifestyle or other factors, such as travel or where you work. What tests do I need? Screening Your health care provider may recommend screening tests for certain conditions. This may include: Lipid and cholesterol levels. Diabetes screening. This is done by checking your blood sugar (glucose) after you have not eaten for a while (fasting). Hepatitis B test. Hepatitis C test. HIV (human immunodeficiency virus) test. STI (sexually transmitted infection) testing, if you are at risk. Lung cancer screening. Prostate cancer screening. Colorectal cancer screening. Talk with your health care provider  about your test results, treatment options, and if necessary, the need for more tests. Follow these instructions at home: Eating and drinking  Eat a diet that includes fresh fruits and vegetables, whole grains, lean protein, and low-fat dairy products. Take vitamin and mineral supplements as recommended by your health care provider. Do not drink alcohol if your  health care provider tells you not to drink. If you drink alcohol: Limit how much you have to 0-2 drinks a day. Know how much alcohol is in your drink. In the [place] one drink equals one 12 oz bottle of beer (355 mL), one 5 oz glass of wine (148 mL), or one 1 oz glass of hard liquor (44 mL). Lifestyle Brush your teeth every morning and night with fluoride toothpaste. Floss one time each day. Exercise for at least 30 minutes 5 or more days each week. Do not use any products that contain nicotine or tobacco. These products include cigarettes, chewing tobacco, and vaping devices, such as e-cigarettes. If you need help quitting, ask your health care provider. Do not use drugs. If you are sexually active, practice safe sex. Use a condom or other form of protection to prevent STIs. Take aspirin only as told by your health care provider. Make sure that you understand how much to take and what form to take. Work with your health care provider to find out whether it is safe and beneficial for you to take aspirin daily. Find healthy ways to manage stress, such as: Meditation, yoga, or listening to music. Journaling. Talking to a trusted person. Spending time with friends and family. Minimize exposure to UV radiation to reduce your risk of skin cancer. Safety Always wear your seat belt while driving or riding in a vehicle. Do not drive: If you have been drinking alcohol. Do not ride with someone who has been drinking. When you are tired or distracted. While texting. If you have been using any mind-altering substances or drugs. Wear a helmet and other protective equipment during sports activities. If you have firearms in your house, make sure you follow all gun safety procedures. What's next? Go to your health care provider once a year for an annual wellness visit. Ask your health care provider how often you should have your eyes and teeth checked. Stay up to date on all vaccines. This information  is not intended to replace advice given to you by your health care provider. Make sure you discuss any questions you have with your health care provider. Document Revised: 02/14/2021 Document Reviewed: 02/14/2021 Elsevier Patient Education  [employer].

## 2022-11-11 NOTE — Progress Notes (Signed)
Chief Complaint:  Derek Wong is a 62 y.o. male who presents today for his annual comprehensive physical exam.    Assessment/Plan:  Chronic Problems Addressed Today: Essential hypertension Blood pressure much better controlled today at 137/78.  He has been doing well with amlodipine 5 mg daily.  No significant side effects.  His headache and dizziness that he was experiencing couple weeks has subsided as well.  He would like to come off medications or wean down if at all possible.  He will continue with current dose for at least 4 more weeks.  Advised patient to go to half a dose instead of discontinuing and check in with me in a few weeks if he wishes to wean off. We discussed lifestyle modifications.  Discussed the importance of good blood pressure control and that we should have his home readings in the 130s over 80s or lower.  He voiced understanding.  He will follow-up with [place] in a few weeks via MyChart.  Dyslipidemia Check labs.  Prediabetes Check labs.  Discussed lifestyle modifications.  Preventative Healthcare: Check labs.  Will order Cologuard.  Patient Counseling(The following topics were reviewed and/or handout was given):  -Nutrition: Stressed importance of moderation in sodium/caffeine intake, saturated fat and cholesterol, caloric balance, sufficient intake of fresh fruits, vegetables, and fiber.  -Stressed the importance of regular exercise.   -Substance Abuse: Discussed cessation/primary prevention of tobacco, alcohol, or other drug use; driving or other dangerous activities under the influence; availability of treatment for abuse.   -Injury prevention: Discussed safety belts, safety helmets, smoke detector, smoking near bedding or upholstery.   -Sexuality: Discussed sexually transmitted diseases, partner selection, use of condoms, avoidance of unintended pregnancy and contraceptive alternatives.   -Dental health: Discussed importance of regular tooth brushing,  flossing, and dental visits.  -Health maintenance and immunizations reviewed. Please refer to Health maintenance section.  Return to care in 1 year for next preventative visit.     Subjective:  HPI:  He has no acute complaints today.   We saw him a few weeks ago for hypertension.  Blood pressure was elevated into the 180s and 190s at that time.  After long discussion he was agreeable to start amlodipine 5 mg daily.  He has been tolerating well.  Home blood pressure readings have been trending down and recently have been mostly in the 130s over 70s to 80s.  Lifestyle Diet: Balanced. Plenty of fruits and vegetables.  Exercise: Walking and jogging.      11/11/2022    8:09 AM  Depression screen PHQ 2/9  Decreased Interest 0  Down, Depressed, Hopeless 0  PHQ - 2 Score 0    Health Maintenance Due  Topic Date Due   Fecal DNA (Cologuard)  Never done     ROS: Per HPI, otherwise a complete review of systems was negative.   PMH:  The following were reviewed and entered/updated in epic: Past Medical History:  Diagnosis Date   BACK PAIN 10/27/2008   BACK PAIN 10/27/2008   Qualifier: Diagnosis of  By: Burnice Logan  MD, Doretha Sou    Cataract    Elevated transaminase level 02/08/2011   Gynecomastia, male 01/10/2016   GYNECOMASTIA, UNILATERAL 07/07/2008   Head injury, closed    HYPERLIPIDEMIA 10/23/2007   Hypogonadism in male 10/02/2015   OTALGIA 09/21/2007   POSITIVE TB SKIN TEST, WITHOUT TUBERCULOSIS 04/26/2008   PVCs (premature ventricular contractions) 05/06/2018   Testosterone deficiency    Patient Active Problem List   Diagnosis Date  Noted   Essential hypertension 11/08/2021   Prediabetes 09/11/2018   POSITIVE TB SKIN TEST, WITHOUT TUBERCULOSIS 04/26/2008   Dyslipidemia 10/23/2007   Past Surgical History:  Procedure Laterality Date   NO PAST SURGERIES      Family History  Problem Relation Age of Onset   Diabetes Father    Hypercholesterolemia Father    Colon  cancer Neg Hx    Other Neg Hx        hypogonadism    Medications- reviewed and updated Current Outpatient Medications  Medication Sig Dispense Refill   amLODipine (NORVASC) 5 MG tablet Take 1 tablet (5 mg total) by mouth daily. 90 tablet 3   No current facility-administered medications for this visit.    Allergies-reviewed and updated No Known Allergies  Social History   Socioeconomic History   Marital status: Married    Spouse name: Not on file   Number of children: Not on file   Years of education: Not on file   Highest education level: Not on file  Occupational History   Not on file  Tobacco Use   Smoking status: Never   Smokeless tobacco: Never  Substance and Sexual Activity   Alcohol use: No   Drug use: No   Sexual activity: Not on file  Other Topics Concern   Not on file  Social History Narrative   Not on file   Social Determinants of Health   Financial Resource Strain: Not on file  Food Insecurity: Not on file  Transportation Needs: Not on file  Physical Activity: Not on file  Stress: Not on file  Social Connections: Not on file        Objective:  Physical Exam: BP 137/78   Pulse 73   Temp (!) 97.5 F (36.4 C) (Temporal)   Ht '5\' 7"'$  (1.702 m)   Wt 172 lb 3.2 oz (78.1 kg)   SpO2 100%   BMI 26.97 kg/m   Body mass index is 26.97 kg/m. Wt Readings from Last 3 Encounters:  11/11/22 172 lb 3.2 oz (78.1 kg)  10/21/22 174 lb 9.6 oz (79.2 kg)  11/08/21 165 lb 9.6 oz (75.1 kg)   Gen: NAD, resting comfortably HEENT: TMs normal bilaterally. OP clear. No thyromegaly noted.  CV: RRR with no murmurs appreciated Pulm: NWOB, CTAB with no crackles, wheezes, or rhonchi GI: Normal bowel sounds present. Soft, Nontender, Nondistended. MSK: no edema, cyanosis, or clubbing noted Skin: warm, dry Neuro: CN2-12 grossly intact. Strength 5/5 in upper and lower extremities. Reflexes symmetric and intact bilaterally.  Psych: Normal affect and thought content      Derek Wong M. Jerline Pain, MD 11/11/2022 8:43 AM

## 2022-11-12 LAB — HIV ANTIBODY (ROUTINE TESTING W REFLEX): HIV 1&2 Ab, 4th Generation: NONREACTIVE

## 2022-11-14 NOTE — Progress Notes (Signed)
Please inform patient of the following:  Cholesterol levels are elevated slightly more than last year.  He may benefit from starting cholesterol medication to improve numbers and low risk heart attack and stroke.  Please send in Lipitor 40 mg daily if he is agreeable to start.  The rest of his labs are all stable.  He should continue to work on diet and exercise and we can recheck everything in a year.

## 2022-11-21 LAB — COLOGUARD: COLOGUARD: NEGATIVE

## 2022-11-21 NOTE — Progress Notes (Signed)
Please inform patient of the following:  Great news! Cologuard is negative. We can repeat in 3 years.  Derek Wong. Jerline Pain, MD 11/21/2022 12:33 PM

## 2022-11-26 ENCOUNTER — Other Ambulatory Visit: Payer: Self-pay

## 2022-11-26 MED ORDER — ATORVASTATIN CALCIUM 40 MG PO TABS: 40.0000 mg | ORAL_TABLET | Freq: Every day | ORAL | 3 refills | Status: AC

## 2024-02-03 ENCOUNTER — Ambulatory Visit (INDEPENDENT_AMBULATORY_CARE_PROVIDER_SITE_OTHER): Admitting: Family Medicine

## 2024-02-03 ENCOUNTER — Encounter: Payer: Self-pay | Admitting: Family Medicine

## 2024-02-03 VITALS — BP 161/79 | HR 74 | Temp 97.9°F | Ht 67.0 in | Wt 171.0 lb

## 2024-02-03 DIAGNOSIS — Z0001 Encounter for general adult medical examination with abnormal findings: Secondary | ICD-10-CM

## 2024-02-03 DIAGNOSIS — R7303 Prediabetes: Secondary | ICD-10-CM | POA: Diagnosis not present

## 2024-02-03 DIAGNOSIS — R42 Dizziness and giddiness: Secondary | ICD-10-CM | POA: Diagnosis not present

## 2024-02-03 DIAGNOSIS — Z23 Encounter for immunization: Secondary | ICD-10-CM

## 2024-02-03 DIAGNOSIS — I10 Essential (primary) hypertension: Secondary | ICD-10-CM | POA: Diagnosis not present

## 2024-02-03 DIAGNOSIS — E785 Hyperlipidemia, unspecified: Secondary | ICD-10-CM | POA: Diagnosis not present

## 2024-02-03 LAB — COMPREHENSIVE METABOLIC PANEL WITH GFR
ALT: 19 U/L (ref 0–53)
AST: 23 U/L (ref 0–37)
Albumin: 4.4 g/dL (ref 3.5–5.2)
Alkaline Phosphatase: 64 U/L (ref 39–117)
BUN: 18 mg/dL (ref 6–23)
CO2: 29 meq/L (ref 19–32)
Calcium: 9.5 mg/dL (ref 8.4–10.5)
Chloride: 104 meq/L (ref 96–112)
Creatinine, Ser: 1.32 mg/dL (ref 0.40–1.50)
GFR: 56.93 mL/min — ABNORMAL LOW (ref >60.00)
Glucose, Bld: 95 mg/dL (ref 70–99)
Potassium: 4.3 meq/L (ref 3.5–5.1)
Sodium: 139 meq/L (ref 135–145)
Total Bilirubin: 0.5 mg/dL (ref 0.2–1.2)
Total Protein: 7.4 g/dL (ref 6.0–8.3)

## 2024-02-03 LAB — CBC
HCT: 38.8 % — ABNORMAL LOW (ref 39.0–52.0)
Hemoglobin: 13.2 g/dL (ref 13.0–17.0)
MCHC: 34 g/dL (ref 30.0–36.0)
MCV: 85.7 fl (ref 78.0–100.0)
Platelets: 168 10*3/uL (ref 150.0–400.0)
RBC: 4.53 Mil/uL (ref 4.22–5.81)
RDW: 13.4 % (ref 11.5–15.5)
WBC: 5 10*3/uL (ref 4.0–10.5)

## 2024-02-03 LAB — LIPID PANEL
Cholesterol: 227 mg/dL — ABNORMAL HIGH (ref 0–200)
HDL: 45.7 mg/dL (ref >39.00)
LDL Cholesterol: 158 mg/dL — ABNORMAL HIGH (ref 0–99)
NonHDL: 181.07
Total CHOL/HDL Ratio: 5
Triglycerides: 116 mg/dL (ref 0.0–149.0)
VLDL: 23.2 mg/dL (ref 0.0–40.0)

## 2024-02-03 LAB — TSH: TSH: 1.84 u[IU]/mL (ref 0.35–5.50)

## 2024-02-03 LAB — HEMOGLOBIN A1C: Hgb A1c MFr Bld: 5.7 % (ref 4.6–6.5)

## 2024-02-03 NOTE — Assessment & Plan Note (Signed)
 Check A1c.

## 2024-02-03 NOTE — Patient Instructions (Signed)
 It was very nice to see you today!  We need to work on getting your blood pressure better controlled.  Please make sure that you are consistent with the amlodipine .  Please follow-up with me in a couple weeks let me know how your blood pressures are doing at home.  I believe your dizziness may be due to your high blood pressure readings.  Will check labs today and refer you to see a neurologist for further evaluation.  Will give your shingles vaccine today.  Will see back in year for your next physical.  Come back sooner if needed.  Return in about 1 year (around 02/02/2025) for Annual Physical.   Take care, Dr Daneil Dunker  PLEASE NOTE:  If you had any lab tests, please let us  know if you have not heard back within a few days. You may see your results on mychart before we have a chance to review them but we will give you a call once they are reviewed by us .   If we ordered any referrals today, please let us  know if you have not heard from their office within the next week.   If you had any urgent prescriptions sent in today, please check with the pharmacy within an hour of our visit to make sure the prescription was transmitted appropriately.   Please try these tips to maintain a healthy lifestyle:  Eat at least 3 REAL meals and 1-2 snacks per day.  Aim for no more than 5 hours between eating.  If you eat breakfast, please do so within one hour of getting up.   Each meal should contain half fruits/vegetables, one quarter protein, and one quarter carbs (no bigger than a computer mouse)  Cut down on sweet beverages. This includes juice, soda, and sweet tea.   Drink at least 1 glass of water with each meal and aim for at least 8 glasses per day  Exercise at least 150 minutes every week.    Preventive Care 30-71 Years Old, Male Preventive care refers to lifestyle choices and visits with your health care provider that can promote health and wellness. Preventive care visits are also called  wellness exams. What can I expect for my preventive care visit? Counseling During your preventive care visit, your health care provider may ask about your: Medical history, including: Past medical problems. Family medical history. Current health, including: Emotional well-being. Home life and relationship well-being. Sexual activity. Lifestyle, including: Alcohol, nicotine or tobacco, and drug use. Access to firearms. Diet, exercise, and sleep habits. Safety issues such as seatbelt and bike helmet use. Sunscreen use. Work and work Astronomer. Physical exam Your health care provider will check your: Height and weight. These may be used to calculate your BMI (body mass index). BMI is a measurement that tells if you are at a healthy weight. Waist circumference. This measures the distance around your waistline. This measurement also tells if you are at a healthy weight and may help predict your risk of certain diseases, such as type 2 diabetes and high blood pressure. Heart rate and blood pressure. Body temperature. Skin for abnormal spots. What immunizations do I need?  Vaccines are usually given at various ages, according to a schedule. Your health care provider will recommend vaccines for you based on your age, medical history, and lifestyle or other factors, such as travel or where you work. What tests do I need? Screening Your health care provider may recommend screening tests for certain conditions. This may include: Lipid and  cholesterol levels. Diabetes screening. This is done by checking your blood sugar (glucose) after you have not eaten for a while (fasting). Hepatitis B test. Hepatitis C test. HIV (human immunodeficiency virus) test. STI (sexually transmitted infection) testing, if you are at risk. Lung cancer screening. Prostate cancer screening. Colorectal cancer screening. Talk with your health care provider about your test results, treatment options, and if  necessary, the need for more tests. Follow these instructions at home: Eating and drinking  Eat a diet that includes fresh fruits and vegetables, whole grains, lean protein, and low-fat dairy products. Take vitamin and mineral supplements as recommended by your health care provider. Do not drink alcohol if your health care provider tells you not to drink. If you drink alcohol: Limit how much you have to 0-2 drinks a day. Know how much alcohol is in your drink. In the [place] one drink equals one 12 oz bottle of beer (355 mL), one 5 oz glass of wine (148 mL), or one 1 oz glass of hard liquor (44 mL). Lifestyle Brush your teeth every morning and night with fluoride toothpaste. Floss one time each day. Exercise for at least 30 minutes 5 or more days each week. Do not use any products that contain nicotine or tobacco. These products include cigarettes, chewing tobacco, and vaping devices, such as e-cigarettes. If you need help quitting, ask your health care provider. Do not use drugs. If you are sexually active, practice safe sex. Use a condom or other form of protection to prevent STIs. Take aspirin only as told by your health care provider. Make sure that you understand how much to take and what form to take. Work with your health care provider to find out whether it is safe and beneficial for you to take aspirin daily. Find healthy ways to manage stress, such as: Meditation, yoga, or listening to music. Journaling. Talking to a trusted person. Spending time with friends and family. Minimize exposure to UV radiation to reduce your risk of skin cancer. Safety Always wear your seat belt while driving or riding in a vehicle. Do not drive: If you have been drinking alcohol. Do not ride with someone who has been drinking. When you are tired or distracted. While texting. If you have been using any mind-altering substances or drugs. Wear a helmet and other protective equipment during sports  activities. If you have firearms in your house, make sure you follow all gun safety procedures. What's next? Go to your health care provider once a year for an annual wellness visit. Ask your health care provider how often you should have your eyes and teeth checked. Stay up to date on all vaccines. This information is not intended to replace advice given to you by your health care provider. Make sure you discuss any questions you have with your health care provider. Document Revised: 02/14/2021 Document Reviewed: 02/14/2021 Elsevier Patient Education  2024 [employer].

## 2024-02-03 NOTE — Assessment & Plan Note (Signed)
 Had a lengthy discussion with patient today regarding his dizziness.  This has been intermittent in nature for the last few years.  We did obtain an echocardiogram a few years ago which was negative.  Orthostatic vital signs were also negative.  Labs were all reassuring.  Of note he did have improvement in symptoms with better control of his blood pressure however he has not been consistent with taking his amlodipine  over the last several months and has had persistent dizziness over that time.  We are addressing his blood pressure as below which is likely contributing to his sensations of dizziness however given persistence of symptoms he would like to see a specialist at this point.  Will place referral to see neurology.

## 2024-02-03 NOTE — Progress Notes (Signed)
 Chief Complaint:  Derek Wong is a 63 y.o. male who presents today for his annual comprehensive physical exam.    Assessment/Plan:  Chronic Problems Addressed Today: Dizziness Had a lengthy discussion with patient today regarding his dizziness.  This has been intermittent in nature for the last few years.  We did obtain an echocardiogram a few years ago which was negative.  Orthostatic vital signs were also negative.  Labs were all reassuring.  Of note he did have improvement in symptoms with better control of his blood pressure however he has not been consistent with taking his amlodipine  over the last several months and has had persistent dizziness over that time.  We are addressing his blood pressure as below which is likely contributing to his sensations of dizziness however given persistence of symptoms he would like to see a specialist at this point.  Will place referral to see neurology.  Essential hypertension Not at goal today.  He has been very hesitant and reluctant to take medications for this.  He is prescribed amlodipine  5 mg daily which has worked well for him recently however he has not been consistent with taking this over the last several weeks.  We did discuss importance of maintaining good blood pressure control to prevent complications such as heart attack and stroke.  Additionally it seems like his dizziness significantly improves when his blood pressure is better controlled as well.  Patient stated he would try to be more consistent with this going forward.  He will follow-up with us  in a few weeks.  Blood pressure is not controlled on amlodipine  5 mg daily can increase the dose as tolerated.  Will check labs today.  Dyslipidemia Check lipids.  Prediabetes Check A1c.  Preventative Healthcare: Check labs.  Shingrix given today.  Received a Tdap earlier this year.  Patient Counseling(The following topics were reviewed and/or handout was given):  -Nutrition:  Stressed importance of moderation in sodium/caffeine intake, saturated fat and cholesterol, caloric balance, sufficient intake of fresh fruits, vegetables, and fiber.  -Stressed the importance of regular exercise.   -Substance Abuse: Discussed cessation/primary prevention of tobacco, alcohol, or other drug use; driving or other dangerous activities under the influence; availability of treatment for abuse.   -Injury prevention: Discussed safety belts, safety helmets, smoke detector, smoking near bedding or upholstery.   -Sexuality: Discussed sexually transmitted diseases, partner selection, use of condoms, avoidance of unintended pregnancy and contraceptive alternatives.   -Dental health: Discussed importance of regular tooth brushing, flossing, and dental visits.  -Health maintenance and immunizations reviewed. Please refer to Health maintenance section.  Return to care in 1 year for next preventative visit.     Subjective:  HPI:  Patient is here today for his annual physical. See Assessment / plan for status of chronic conditions. I last saw him about a year ago. Since our last visit he has still had ongoing issues with dizziness. This has been intermittent for the last several years.  At our most recent visit a year ago, he had reported that his headache and dizziness had subsided with treatment for his blood pressure with amlodipine  5 mg daily.  Unfortunately he does admit to not being consistent with taking his medication and has not taking any blood pressure medication for the last couple of weeks.  When he checks his blood pressure at home it is typically in the 160s over 80s.  He is not currently having any headache.  The dizziness has not changed in nature though  does seem to be more frequent recently.  He will have several days per week where he feels like he is "floating".  No room spinning.  No numbness or tingling.  No weakness.  No chest pain or shortness of breath.  Lifestyle Diet:  None specific but trying to get back into healthy diet.  Exercise: Jogging every morning.      02/03/2024   11:15 AM  Depression screen PHQ 2/9  Decreased Interest 0  Down, Depressed, Hopeless 0  PHQ - 2 Score 0    There are no preventive care reminders to display for this patient.    ROS: Per HPI, otherwise a complete review of systems was negative.   PMH:  The following were reviewed and entered/updated in epic: Past Medical History:  Diagnosis Date   BACK PAIN 10/27/2008   BACK PAIN 10/27/2008   Qualifier: Diagnosis of  By: Minnette Amato  MD, Ronie Cohen    Cataract    Elevated transaminase level 02/08/2011   Gynecomastia, male 01/10/2016   GYNECOMASTIA, UNILATERAL 07/07/2008   Head injury, closed    HYPERLIPIDEMIA 10/23/2007   Hypogonadism in male 10/02/2015   OTALGIA 09/21/2007   POSITIVE TB SKIN TEST, WITHOUT TUBERCULOSIS 04/26/2008   PVCs (premature ventricular contractions) 05/06/2018   Testosterone  deficiency    Patient Active Problem List   Diagnosis Date Noted   Dizziness 02/03/2024   Essential hypertension 11/08/2021   Prediabetes 09/11/2018   POSITIVE TB SKIN TEST, WITHOUT TUBERCULOSIS 04/26/2008   Dyslipidemia 10/23/2007   Past Surgical History:  Procedure Laterality Date   NO PAST SURGERIES      Family History  Problem Relation Age of Onset   Diabetes Father    Hypercholesterolemia Father    Colon cancer Neg Hx    Other Neg Hx        hypogonadism    Medications- reviewed and updated Current Outpatient Medications  Medication Sig Dispense Refill   amLODipine  (NORVASC ) 5 MG tablet Take 1 tablet (5 mg total) by mouth daily. 90 tablet 3   atorvastatin  (LIPITOR) 40 MG tablet Take 1 tablet (40 mg total) by mouth daily. 90 tablet 3   No current facility-administered medications for this visit.    Allergies-reviewed and updated No Known Allergies  Social History   Socioeconomic History   Marital status: Married    Spouse name: Not on file    Number of children: Not on file   Years of education: Not on file   Highest education level: Not on file  Occupational History   Not on file  Tobacco Use   Smoking status: Never   Smokeless tobacco: Never  Substance and Sexual Activity   Alcohol use: No   Drug use: No   Sexual activity: Not on file  Other Topics Concern   Not on file  Social History Narrative   Not on file   Social Drivers of Health   Financial Resource Strain: Not on file  Food Insecurity: Not on file  Transportation Needs: Not on file  Physical Activity: Not on file  Stress: Not on file  Social Connections: Not on file        Objective:  Physical Exam: BP (!) 161/79   Pulse 74   Temp 97.9 F (36.6 C) (Temporal)   Ht 5\' 7"  (1.702 m)   Wt 171 lb (77.6 kg)   SpO2 99%   BMI 26.78 kg/m   Body mass index is 26.78 kg/m. Wt Readings from Last 3 Encounters:  02/03/24  171 lb (77.6 kg)  11/11/22 172 lb 3.2 oz (78.1 kg)  10/21/22 174 lb 9.6 oz (79.2 kg)   Gen: NAD, resting comfortably HEENT: TMs normal bilaterally. OP clear. No thyromegaly noted.  CV: RRR with no murmurs appreciated Pulm: NWOB, CTAB with no crackles, wheezes, or rhonchi GI: Normal bowel sounds present. Soft, Nontender, Nondistended. MSK: no edema, cyanosis, or clubbing noted Skin: warm, dry Neuro: CN2-12 grossly intact. Strength 5/5 in upper and lower extremities. Reflexes symmetric and intact bilaterally.  Psych: Normal affect and thought content     Lahela Woodin M. Daneil Dunker, MD 02/03/2024 12:41 PM

## 2024-02-03 NOTE — Assessment & Plan Note (Signed)
 Not at goal today.  He has been very hesitant and reluctant to take medications for this.  He is prescribed amlodipine  5 mg daily which has worked well for him recently however he has not been consistent with taking this over the last several weeks.  We did discuss importance of maintaining good blood pressure control to prevent complications such as heart attack and stroke.  Additionally it seems like his dizziness significantly improves when his blood pressure is better controlled as well.  Patient stated he would try to be more consistent with this going forward.  He will follow-up with us  in a few weeks.  Blood pressure is not controlled on amlodipine  5 mg daily can increase the dose as tolerated.  Will check labs today.

## 2024-02-03 NOTE — Assessment & Plan Note (Signed)
 Check lipids

## 2024-02-04 ENCOUNTER — Ambulatory Visit: Payer: Self-pay | Admitting: Family Medicine

## 2024-02-04 NOTE — Progress Notes (Signed)
 Cholesterol elevated.  He would benefit from starting cholesterol medication to improve numbers in the low risk of heart attack and stroke.  Please send in Lipitor 40 mg daily if he is agreeable to start.  The rest of his labs are all stable.  He is having some age related decline in kidney function.  It is very important that we continue to manage his blood pressure to prevent future kidney issues.  I would like for him to monitor blood pressure at home and follow-up with us  again in a few weeks as we discussed at his office visit.  The rest of his labs are all at goal.

## 2024-02-10 ENCOUNTER — Encounter: Payer: Self-pay | Admitting: Neurology

## 2024-03-23 ENCOUNTER — Other Ambulatory Visit: Payer: Self-pay | Admitting: Family Medicine

## 2024-04-29 ENCOUNTER — Ambulatory Visit: Admitting: Neurology

## 2024-04-29 ENCOUNTER — Encounter: Payer: Self-pay | Admitting: Neurology

## 2024-04-29 VITALS — BP 120/86 | HR 87 | Resp 20 | Ht 67.0 in | Wt 170.0 lb

## 2024-04-29 DIAGNOSIS — R42 Dizziness and giddiness: Secondary | ICD-10-CM | POA: Diagnosis not present

## 2024-04-29 NOTE — Patient Instructions (Addendum)
 Good to meet you.   Schedule MRI brain without contrast at [facility] [phone]  2. Please start monitoring blood pressure when you feel dizzy and also when you are feeling fine, and see if there is a difference. Continue to follow-up with Dr. Kennyth on BP control  3. Our office will call with MRI results, if normal, follow-up as needed.

## 2024-04-29 NOTE — Progress Notes (Signed)
 NEUROLOGY CONSULTATION NOTE  Derek Wong MRN: [id number] DOB: 9/287/1962  Referring provider: Dr. Worth Kitty Primary care provider: Dr. Worth Kitty  Reason for consult:  dizziness  Dear Dr Kitty:  Thank you for your kind referral of Derek Wong for consultation of the above symptoms. Although his history is well known to you, please allow me to reiterate it for the purpose of our medical record. 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 Records and images were personally reviewed where available.   HISTORY OF PRESENT ILLNESS: This is a 63 year old right-handed man with a history of hypertension, hyperlipidemia, presenting for evaluation of dizziness. Symptoms started a few years ago, he reports feeling like his head is so light when he stands up, like he would pass out. Sometimes he has a headache with them, usually after working hard, with pain on the vertex. He sits down still and symptoms resolve in a few minutes. No loss of consciousness. There is associated blurred/tunnel vision, nausea/vomiting, focal numbness/tingling/weakness. Dizziness does not occur when supine and do not wake him from sleep. There is no spinning sensation. He denies any tinnitus, hearing loss, diplopia, dysarthria/dysphagia. He used to have neck pain but this is better. He states his balance is good, no falls. PCP notes from June indicate he is prescribed Amlodipine  which worked for him, however he had not been consistent taking it for several weeks, and it appears dizziness significantly improves when BP is better controlled. Since then, he has been taking medication regularly, BP today 120/86. He states the last dizzy episode was 3 weeks ago, this is the longest symptom-free period he has had. No family history of similar symptoms.    PAST MEDICAL HISTORY: Past Medical History:  Diagnosis Date   BACK PAIN 10/27/2008   BACK PAIN 10/27/2008   Qualifier: Diagnosis of  By: Jame  MD, Maude FALCON     Cataract    Elevated transaminase level 02/08/2011   Gynecomastia, male 01/10/2016   GYNECOMASTIA, UNILATERAL 07/07/2008   Head injury, closed    HYPERLIPIDEMIA 10/23/2007   Hyperlipidemia    Hypertension    Hypogonadism in male 10/02/2015   OTALGIA 09/21/2007   POSITIVE TB SKIN TEST, WITHOUT TUBERCULOSIS 04/26/2008   PVCs (premature ventricular contractions) 05/06/2018   Testosterone  deficiency     PAST SURGICAL HISTORY: Past Surgical History:  Procedure Laterality Date   NO PAST SURGERIES      MEDICATIONS: Current Outpatient Medications on File Prior to Visit  Medication Sig Dispense Refill   amLODipine  (NORVASC ) 5 MG tablet TAKE 1 TABLET BY MOUTH EVERY DAY 90 tablet 3   atorvastatin  (LIPITOR) 40 MG tablet Take 1 tablet (40 mg total) by mouth daily. (Patient not taking: Reported on 04/29/2024) 90 tablet 3   No current facility-administered medications on file prior to visit.    ALLERGIES: No Known Allergies  FAMILY HISTORY: Family History  Problem Relation Age of Onset   Diabetes Father    Hypercholesterolemia Father    Colon cancer Neg Hx    Other Neg Hx        hypogonadism    SOCIAL HISTORY: Social History   Socioeconomic History   Marital status: Married    Spouse name: Not on file   Number of children: 0   Years of education: 18   Highest education level: Not on file  Occupational History   Not on file  Tobacco Use   Smoking status: Never   Smokeless tobacco: Never  Vaping  Use   Vaping status: Never Used  Substance and Sexual Activity   Alcohol use: No   Drug use: No   Sexual activity: Not on file  Other Topics Concern   Not on file  Social History Narrative   Right handed   Lives with wife   Currently working   Drinks caffeine   [employer] home   Social Drivers of Health   Financial Resource Strain: Not on file  Food Insecurity: Not on file  Transportation Needs: Not on file  Physical Activity: Not on file  Stress: Not on file  Social  Connections: Not on file  Intimate Partner Violence: Not on file     PHYSICAL EXAM: Vitals:   04/29/24 1031  BP: 120/86  Pulse: 87  Resp: 20  SpO2: 98%    General: No acute distress Head:  Normocephalic/atraumatic Skin/Extremities: No rash, no edema Neurological Exam: Mental status: alert and awake, no dysarthria or aphasia, Fund of knowledge is appropriate.  Attention and concentration are normal.   Cranial nerves: CN I: not tested CN II: pupils equal, round, visual fields intact CN III, IV, VI:  full range of motion, no nystagmus, no ptosis CN V: facial sensation intact CN VII: upper and lower face symmetric CN VIII: hearing intact to conversation CN XI: sternocleidomastoid and trapezius muscles intact CN XII: tongue midline Bulk & Tone: normal, no cogwheeling, no fasciculations. Motor: 5/5 throughout with no pronator drift. Sensation: intact to light touch, cold, pin, vibration sense.  No extinction to double simultaneous stimulation.  Romberg test negative Deep Tendon Reflexes: +1 throughout Cerebellar: no incoordination on finger to nose, heel to shin testing, normal RAMs Gait: narrow-based and steady, able to tandem walk adequately. Tremor: none   IMPRESSION: This is a 63 year old right-handed man with a history of hypertension, hyperlipidemia, presenting for evaluation of dizziness where he feels lightheaded when standing, sometimes there is a headache. His neurological exam is normal, no cerebellar signs, no parkinsonian signs. We will do a brain MRI without contrast to assess for underlying structural abnormality, however it appears symptoms are better with improved BP control. He was advised to start monitoring BP regularly, as well as when he is symptomatic. Our office will call with MRI brain results, if normal, follow-up as needed.   Thank you for allowing me to participate in the care of this patient. Please do not hesitate to call for any questions or  concerns.   Darice Shivers, M.D.  CC: Dr. Kennyth

## 2024-05-09 ENCOUNTER — Encounter: Payer: Self-pay | Admitting: Neurology

## 2024-05-14 ENCOUNTER — Ambulatory Visit
Admission: RE | Admit: 2024-05-14 | Discharge: 2024-05-14 | Disposition: A | Discharge status: Home / Self Care | Source: Ambulatory Visit | Attending: Neurology | Admitting: Neurology

## 2024-07-04 ENCOUNTER — Ambulatory Visit: Payer: Self-pay | Admitting: Neurology

## 2024-07-05 ENCOUNTER — Telehealth: Payer: Self-pay | Admitting: Neurology

## 2024-07-05 NOTE — Telephone Encounter (Signed)
 Pt called in this morning and  he stated that he is returning call. Thanks

## 2024-07-05 NOTE — Telephone Encounter (Signed)
 Pt called with results

## 2024-07-05 NOTE — Telephone Encounter (Signed)
 Pt called informed MRI brain did not show any tumor or stroke, no changes to cause dizziness. How is he feeling with BP better controlled. He said its not really controlled its still high he was advised to call his DR, he said he was taken the medication that was given,

## 2024-07-05 NOTE — Telephone Encounter (Signed)
-----   Message from Darice CHRISTELLA Shivers sent at 07/04/2024 11:32 PM EST ----- Pls let him know MRI brain did not show any tumor or stroke, no changes to cause dizziness. How is he feeling with BP better controlled? thanks ----- Message ----- From: Interface, Rad Results In Sent: 05/21/2024   4:34 PM EST To: Darice CHRISTELLA Shivers, MD
